# Patient Record
Sex: Male | Born: 1985 | Race: White | Hispanic: No | Marital: Single | Smoking: Current every day smoker
Health system: Southern US, Community
[De-identification: ages and names within clinical notes are randomized; demographics above are authoritative.]

---

## 2004-09-23 ENCOUNTER — Emergency Department: Payer: Self-pay | Admitting: Internal Medicine

## 2008-02-18 ENCOUNTER — Emergency Department: Payer: Self-pay | Admitting: Internal Medicine

## 2008-09-22 ENCOUNTER — Emergency Department: Payer: Self-pay

## 2009-01-29 ENCOUNTER — Emergency Department: Payer: Self-pay | Admitting: Emergency Medicine

## 2009-02-01 ENCOUNTER — Emergency Department: Payer: Self-pay | Admitting: Emergency Medicine

## 2011-02-14 ENCOUNTER — Emergency Department: Payer: Self-pay | Admitting: *Deleted

## 2011-03-02 IMAGING — CR DG HAND COMPLETE 3+V*L*
1 series · 3 of 3 positions shown · non-contrast
Comparison: none

REASON FOR EXAM: left hand pain s/p cat bite
COMMENTS:   LMP: (Male)

PROCEDURE:     DXR - DXR HAND LT COMPLETE  W/OBLIQUES  - February 02, 2009  [DATE]
RESULT:     Images of the left hand demonstrate no fracture, dislocation or
radiopaque foreign body. There is no evidence of bony destruction.

[Series 1: view not recorded · 0.17mm/px · 3 of 3 slices shown]
[im 1/3]
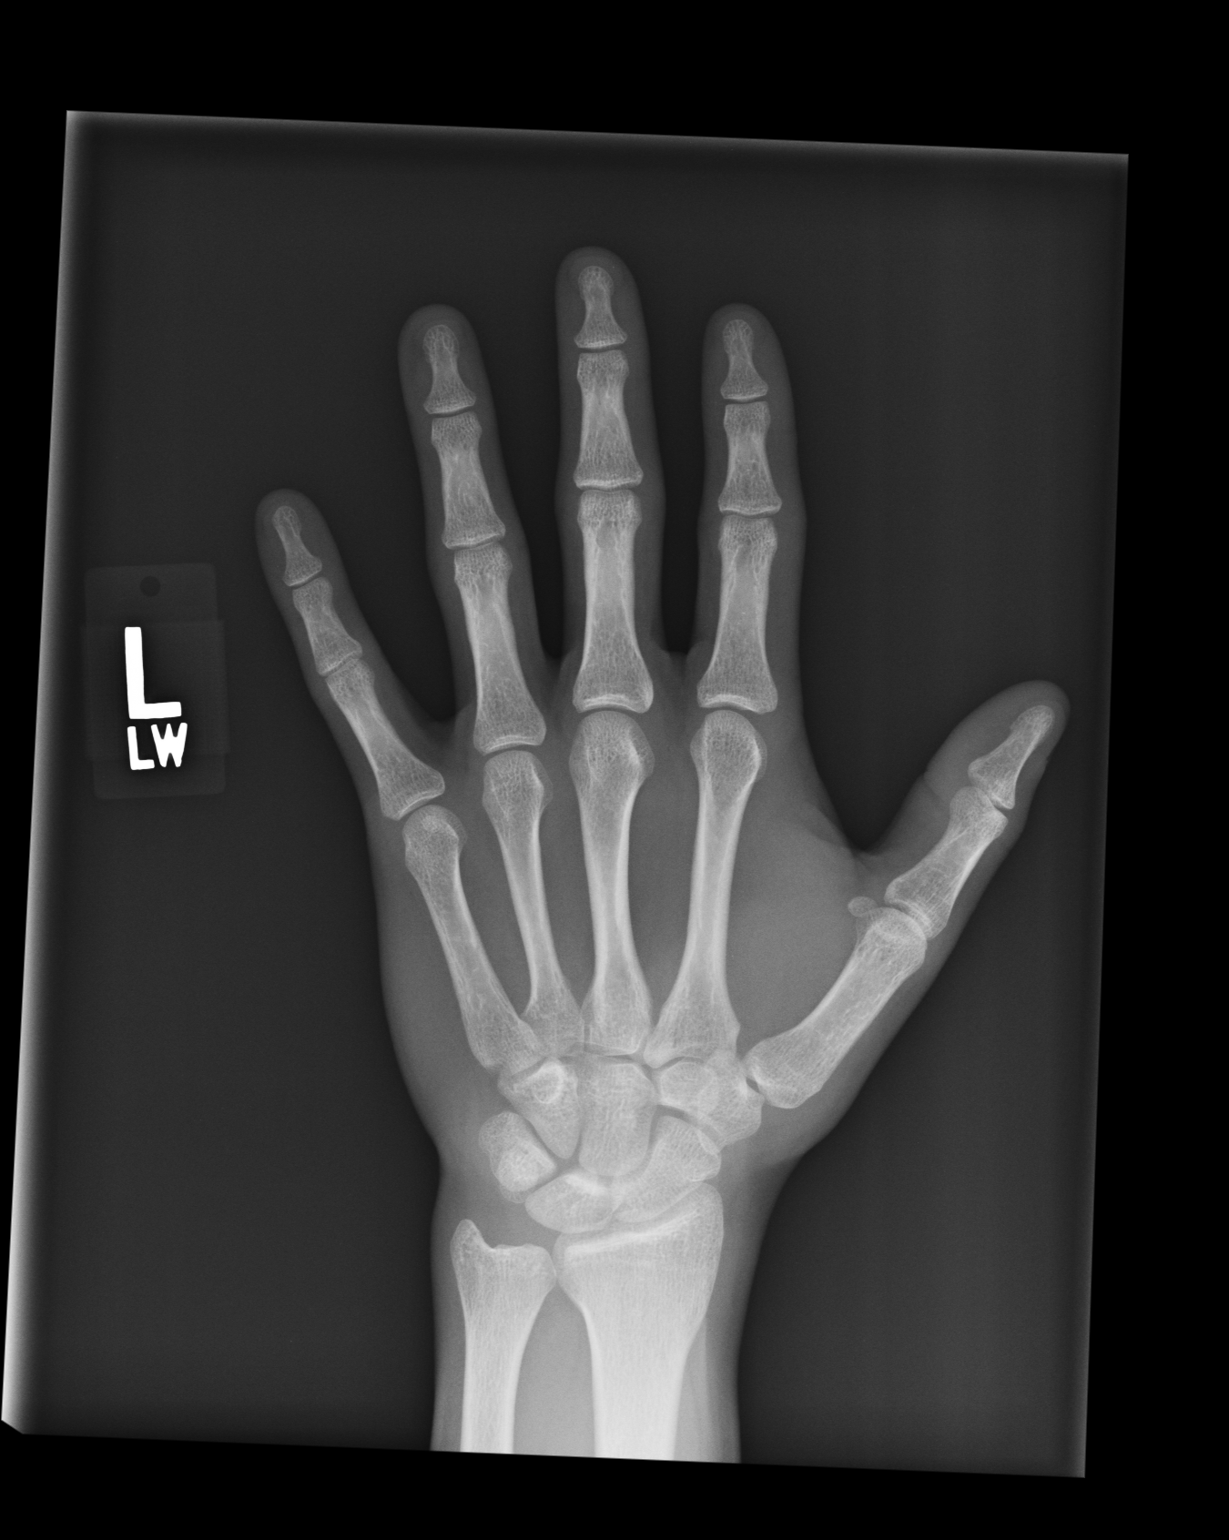
[im 2/3]
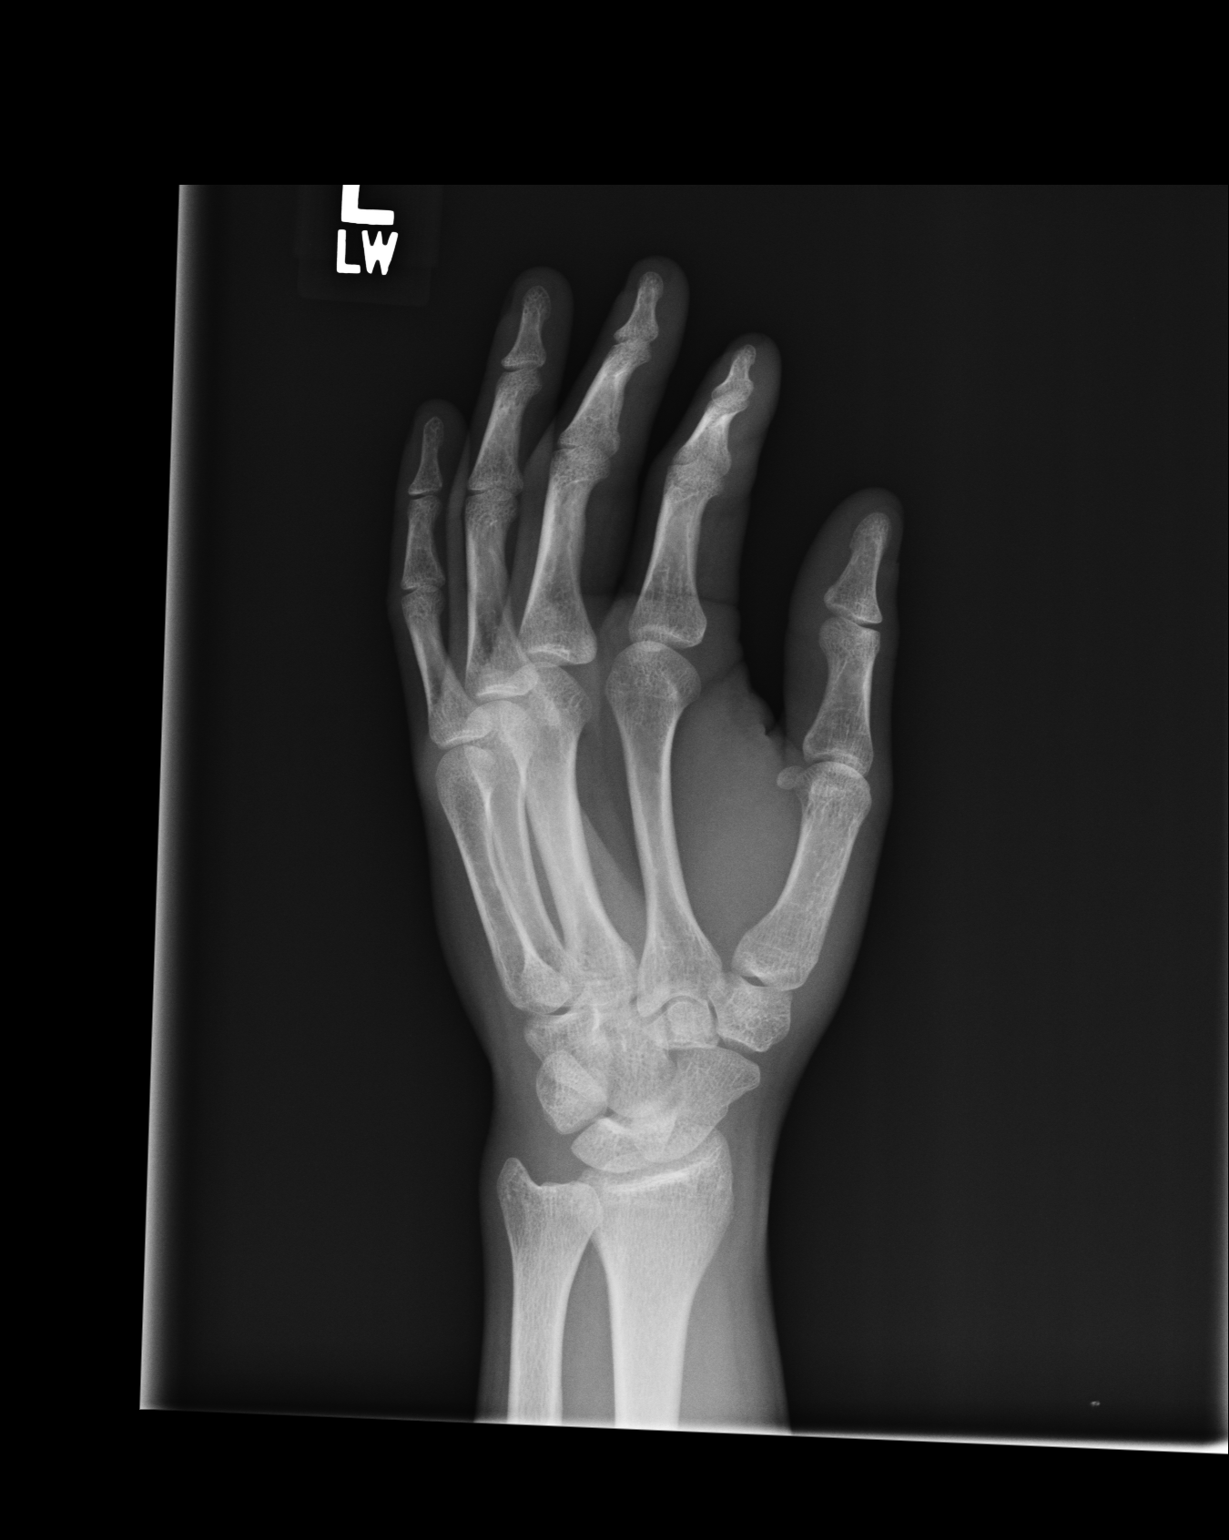
[im 3/3]
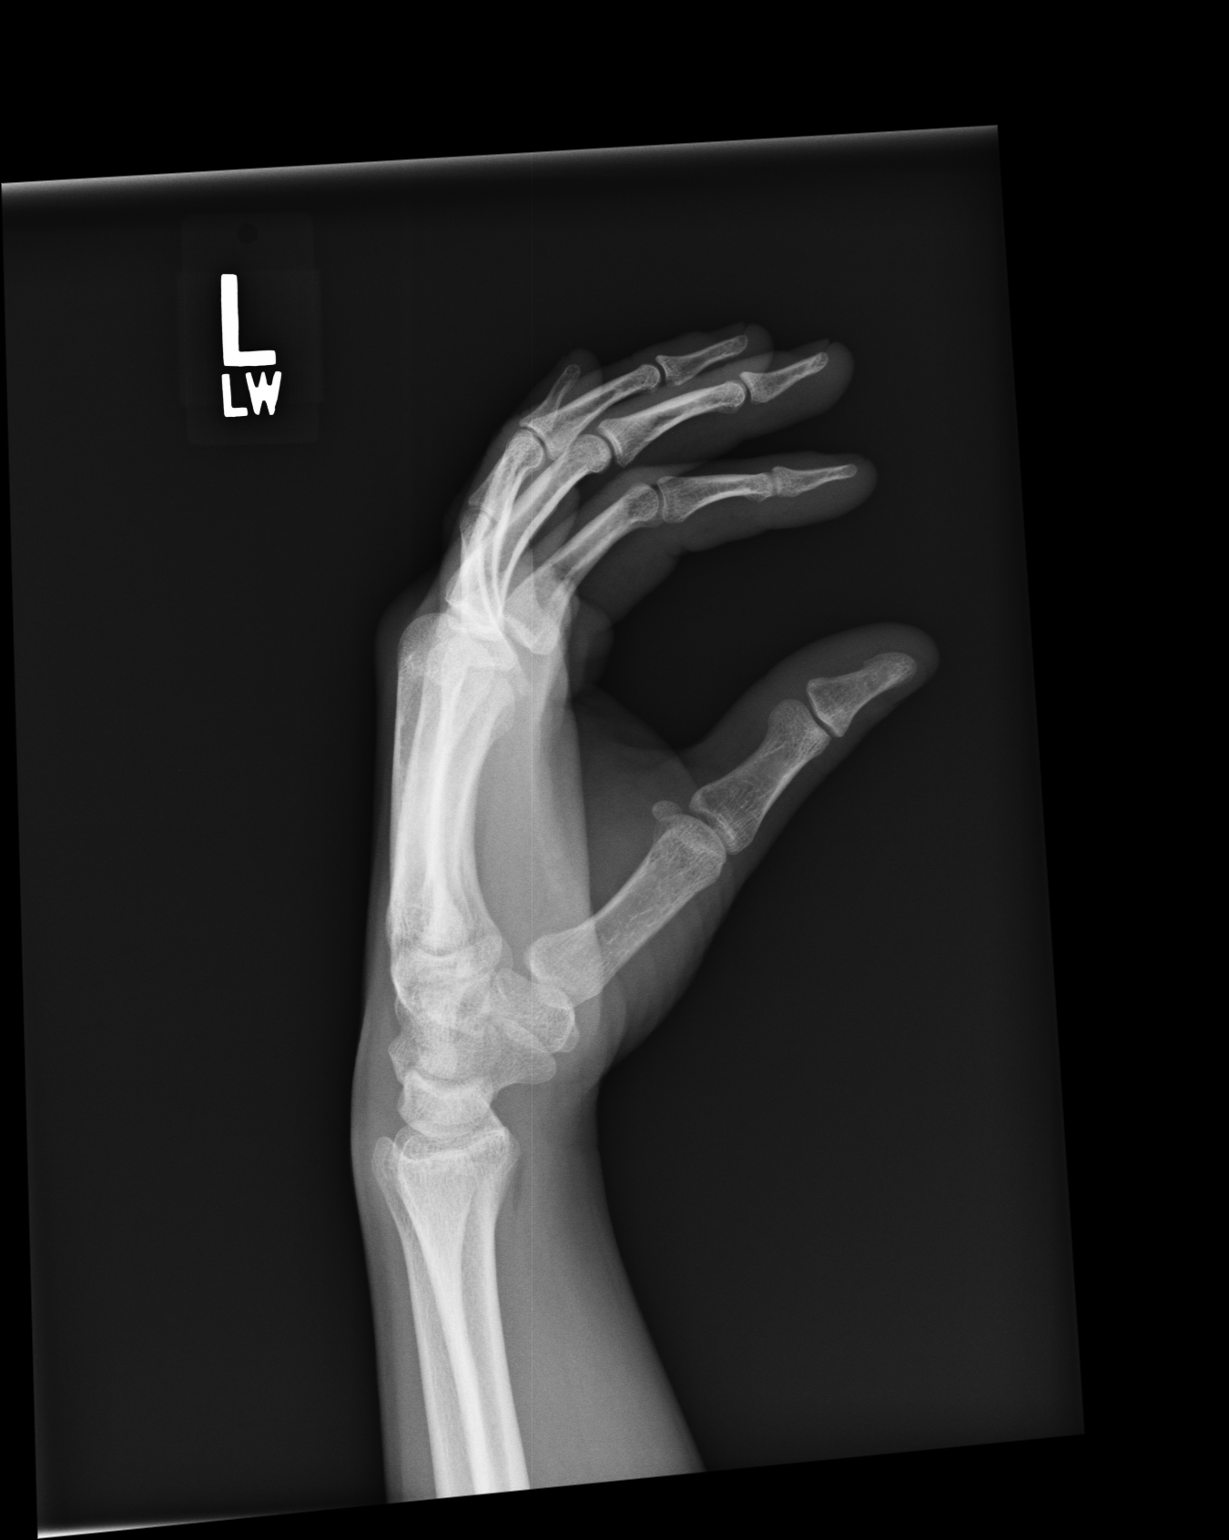

[3 of 3 positions shown; findings below may reference images not displayed]

IMPRESSION: Please see above.

## 2013-11-22 ENCOUNTER — Emergency Department: Payer: Self-pay | Admitting: Emergency Medicine

## 2013-11-22 LAB — COMPREHENSIVE METABOLIC PANEL
ALBUMIN: 4.2 g/dL (ref 3.4–5.0)
ALK PHOS: 69 U/L
ALT: 19 U/L (ref 12–78)
Anion Gap: 4 — ABNORMAL LOW (ref 7–16)
BILIRUBIN TOTAL: 0.3 mg/dL (ref 0.2–1.0)
BUN: 7 mg/dL (ref 7–18)
CALCIUM: 9.4 mg/dL (ref 8.5–10.1)
CREATININE: 0.83 mg/dL (ref 0.60–1.30)
Chloride: 107 mmol/L (ref 98–107)
Co2: 28 mmol/L (ref 21–32)
EGFR (African American): >60
Glucose: 92 mg/dL (ref 65–99)
Osmolality: 275 (ref 275–301)
Potassium: 4.1 mmol/L (ref 3.5–5.1)
SGOT(AST): 27 U/L (ref 15–37)
Sodium: 139 mmol/L (ref 136–145)
Total Protein: 8.2 g/dL (ref 6.4–8.2)

## 2013-11-22 LAB — CBC WITH DIFFERENTIAL/PLATELET
Basophil #: 0.1 10*3/uL (ref 0.0–0.1)
Basophil %: 0.9 %
Eosinophil #: 0.3 10*3/uL (ref 0.0–0.7)
Eosinophil %: 3.8 %
HCT: 44.2 % (ref 40.0–52.0)
HGB: 15 g/dL (ref 13.0–18.0)
LYMPHS PCT: 31.3 %
Lymphocyte #: 2.1 10*3/uL (ref 1.0–3.6)
MCH: 33.1 pg (ref 26.0–34.0)
MCHC: 33.9 g/dL (ref 32.0–36.0)
MCV: 98 fL (ref 80–100)
MONO ABS: 0.6 x10 3/mm (ref 0.2–1.0)
Monocyte %: 8.7 %
NEUTROS PCT: 55.3 %
Neutrophil #: 3.7 10*3/uL (ref 1.4–6.5)
Platelet: 227 10*3/uL (ref 150–440)
RBC: 4.53 10*6/uL (ref 4.40–5.90)
RDW: 13.2 % (ref 11.5–14.5)
WBC: 6.8 10*3/uL (ref 3.8–10.6)

## 2013-11-22 LAB — URINALYSIS, COMPLETE
Bacteria: NONE SEEN
Bilirubin,UR: NEGATIVE
Blood: NEGATIVE
Glucose,UR: NEGATIVE mg/dL (ref 0–75)
Ketone: NEGATIVE
Leukocyte Esterase: NEGATIVE
NITRITE: NEGATIVE
Ph: 7 (ref 4.5–8.0)
Protein: NEGATIVE
RBC,UR: 1 /HPF (ref 0–5)
SQUAMOUS EPITHELIAL: NONE SEEN
Specific Gravity: 1.015 (ref 1.003–1.030)
WBC UR: 1 /HPF (ref 0–5)

## 2014-01-17 ENCOUNTER — Emergency Department: Payer: Self-pay | Admitting: Internal Medicine

## 2014-06-15 ENCOUNTER — Emergency Department: Payer: Self-pay | Admitting: Emergency Medicine

## 2014-06-15 LAB — CBC
HCT: 47.3 % (ref 40.0–52.0)
HGB: 16 g/dL (ref 13.0–18.0)
MCH: 32.4 pg (ref 26.0–34.0)
MCHC: 33.7 g/dL (ref 32.0–36.0)
MCV: 96 fL (ref 80–100)
PLATELETS: 246 10*3/uL (ref 150–440)
RBC: 4.92 10*6/uL (ref 4.40–5.90)
RDW: 13 % (ref 11.5–14.5)
WBC: 9.3 10*3/uL (ref 3.8–10.6)

## 2014-06-15 LAB — URINALYSIS, COMPLETE
BACTERIA: NONE SEEN
BILIRUBIN, UR: NEGATIVE
BLOOD: NEGATIVE
Glucose,UR: >=500 mg/dL (ref 0–75)
Ketone: NEGATIVE
Leukocyte Esterase: NEGATIVE
Nitrite: NEGATIVE
Ph: 5 (ref 4.5–8.0)
Protein: NEGATIVE
RBC, UR: NONE SEEN /HPF (ref 0–5)
SQUAMOUS EPITHELIAL: NONE SEEN
Specific Gravity: 1.017 (ref 1.003–1.030)
WBC UR: <1 /HPF (ref 0–5)

## 2014-06-15 LAB — SALICYLATE LEVEL: Salicylates, Serum: 3.3 mg/dL — ABNORMAL HIGH

## 2014-06-15 LAB — COMPREHENSIVE METABOLIC PANEL
ALBUMIN: 4.2 g/dL (ref 3.4–5.0)
ANION GAP: 6 — AB (ref 7–16)
Alkaline Phosphatase: 76 U/L
BUN: 11 mg/dL (ref 7–18)
Bilirubin,Total: 0.2 mg/dL (ref 0.2–1.0)
Calcium, Total: 9.1 mg/dL (ref 8.5–10.1)
Chloride: 102 mmol/L (ref 98–107)
Co2: 29 mmol/L (ref 21–32)
Creatinine: 1.26 mg/dL (ref 0.60–1.30)
EGFR (African American): >60
EGFR (Non-African Amer.): >60
GLUCOSE: 97 mg/dL (ref 65–99)
OSMOLALITY: 273 (ref 275–301)
POTASSIUM: 3.8 mmol/L (ref 3.5–5.1)
SGOT(AST): 15 U/L (ref 15–37)
SGPT (ALT): 19 U/L
SODIUM: 137 mmol/L (ref 136–145)
TOTAL PROTEIN: 8.1 g/dL (ref 6.4–8.2)

## 2014-06-15 LAB — DRUG SCREEN, URINE
Amphetamines, Ur Screen: NEGATIVE (ref ?–1000)
BARBITURATES, UR SCREEN: NEGATIVE (ref ?–200)
BENZODIAZEPINE, UR SCRN: NEGATIVE (ref ?–200)
Cannabinoid 50 Ng, Ur ~~LOC~~: NEGATIVE (ref ?–50)
Cocaine Metabolite,Ur ~~LOC~~: NEGATIVE (ref ?–300)
MDMA (Ecstasy)Ur Screen: NEGATIVE (ref ?–500)
METHADONE, UR SCREEN: NEGATIVE (ref ?–300)
Opiate, Ur Screen: POSITIVE (ref ?–300)
Phencyclidine (PCP) Ur S: NEGATIVE (ref ?–25)
Tricyclic, Ur Screen: NEGATIVE (ref ?–1000)

## 2014-06-15 LAB — ACETAMINOPHEN LEVEL

## 2014-06-15 LAB — ETHANOL: Ethanol: <3 mg/dL

## 2016-11-27 ENCOUNTER — Emergency Department
Admission: EM | Admit: 2016-11-27 | Discharge: 2016-11-27 | Disposition: A | Discharge status: Home / Self Care | Payer: Self-pay | Attending: Student in an Organized Health Care Education/Training Program | Admitting: Student in an Organized Health Care Education/Training Program

## 2016-11-27 ENCOUNTER — Encounter: Payer: Self-pay | Admitting: Emergency Medicine

## 2016-11-27 DIAGNOSIS — F17200 Nicotine dependence, unspecified, uncomplicated: Secondary | ICD-10-CM | POA: Insufficient documentation

## 2016-11-27 DIAGNOSIS — F1110 Opioid abuse, uncomplicated: Secondary | ICD-10-CM | POA: Insufficient documentation

## 2016-11-27 LAB — CBC
HEMATOCRIT: 42.2 % (ref 40.0–52.0)
Hemoglobin: 14.8 g/dL (ref 13.0–18.0)
MCH: 33.7 pg (ref 26.0–34.0)
MCHC: 35.1 g/dL (ref 32.0–36.0)
MCV: 96 fL (ref 80.0–100.0)
Platelets: 252 10*3/uL (ref 150–440)
RBC: 4.39 MIL/uL — ABNORMAL LOW (ref 4.40–5.90)
RDW: 12.8 % (ref 11.5–14.5)
WBC: 7.4 10*3/uL (ref 3.8–10.6)

## 2016-11-27 LAB — URINE DRUG SCREEN, QUALITATIVE (ARMC ONLY)
AMPHETAMINES, UR SCREEN: NOT DETECTED
BENZODIAZEPINE, UR SCRN: NOT DETECTED
Barbiturates, Ur Screen: NOT DETECTED
Cannabinoid 50 Ng, Ur ~~LOC~~: NOT DETECTED
Cocaine Metabolite,Ur ~~LOC~~: POSITIVE — AB
MDMA (ECSTASY) UR SCREEN: NOT DETECTED
Methadone Scn, Ur: NOT DETECTED
OPIATE, UR SCREEN: NOT DETECTED
PHENCYCLIDINE (PCP) UR S: NOT DETECTED
Tricyclic, Ur Screen: NOT DETECTED

## 2016-11-27 LAB — COMPREHENSIVE METABOLIC PANEL
ALT: 14 U/L — AB (ref 17–63)
AST: 19 U/L (ref 15–41)
Albumin: 3.7 g/dL (ref 3.5–5.0)
Alkaline Phosphatase: 63 U/L (ref 38–126)
Anion gap: 5 (ref 5–15)
BUN: 10 mg/dL (ref 6–20)
CHLORIDE: 105 mmol/L (ref 101–111)
CO2: 27 mmol/L (ref 22–32)
CREATININE: 0.87 mg/dL (ref 0.61–1.24)
Calcium: 9.2 mg/dL (ref 8.9–10.3)
GFR calc Af Amer: >60 mL/min (ref >60)
GFR calc non Af Amer: >60 mL/min (ref >60)
Glucose, Bld: 89 mg/dL (ref 65–99)
Potassium: 3.5 mmol/L (ref 3.5–5.1)
SODIUM: 137 mmol/L (ref 135–145)
Total Bilirubin: 0.3 mg/dL (ref 0.3–1.2)
Total Protein: 7.4 g/dL (ref 6.5–8.1)

## 2016-11-27 LAB — ETHANOL: Alcohol, Ethyl (B): <5 mg/dL (ref <5)

## 2016-11-27 MED ORDER — PROMETHAZINE HCL 12.5 MG PO TABS: 12.5000 mg | ORAL_TABLET | Freq: Four times a day (QID) | ORAL | 0 refills | Status: DC | PRN

## 2016-11-27 MED ORDER — CLONIDINE HCL 0.1 MG PO TABS
0.2000 mg | ORAL_TABLET | Freq: Once | ORAL | Status: AC
  Administered 2016-11-27: 0.2 mg via ORAL
  Filled 2016-11-27: qty 2

## 2016-11-27 MED ORDER — CLONIDINE HCL 0.1 MG PO TABS: 0.1000 mg | ORAL_TABLET | Freq: Two times a day (BID) | ORAL | 0 refills | Status: DC

## 2016-11-27 NOTE — ED Notes (Signed)

## 2016-11-27 NOTE — ED Provider Notes (Signed)
[facility] Emergency Department Provider Note    First MD Initiated Contact with Patient 11/27/16 2057     (approximate)  I have reviewed the triage vital signs and the nursing notes.   HISTORY  Chief Complaint Drug Problem    HPI Randell PatientWilliam F Abila Jr. is a 31 y.o. male who presents with desire to seek rehabilitation and detox from narcotics. Patient had an IVC paperwork taken out by his girlfriend . He said that he was suicidal is very frustrated trying to get into detox. Patient denies any suicidal ideation at this time. Is eager to get help with his narcotic addiction. Does admit to using cocaine several days ago.   History reviewed. No pertinent past medical history. History reviewed. No pertinent family history. History reviewed. No pertinent surgical history. There are no active problems to display for this patient.     Prior to Admission medications   Not on File    Allergies Patient has no known allergies.    Social History Social History  Substance Use Topics  . Smoking status: Current Every Day Smoker  . Smokeless tobacco: Never Used  . Alcohol use No    Review of Systems Patient denies headaches, rhinorrhea, blurry vision, numbness, shortness of breath, chest pain, edema, cough, abdominal pain, nausea, vomiting, diarrhea, dysuria, fevers, rashes or hallucinations unless otherwise stated above in HPI. ____________________________________________   PHYSICAL EXAM:  VITAL SIGNS: Vitals:   11/27/16 2044  BP: (!) 146/97  Pulse: 89  Resp: 18  Temp: 98.7 F (37.1 C)    Constitutional: Alert and oriented. Well appearing and in no acute distress. Eyes: Conjunctivae are normal.  Head: Atraumatic. Nose: No congestion/rhinnorhea. Mouth/Throat: Mucous membranes are moist.   Neck: No stridor. Painless ROM.  Cardiovascular: Normal rate, regular rhythm. Grossly normal heart sounds.  Good peripheral circulation. Respiratory:  Normal respiratory effort.  No retractions. Lungs CTAB. Gastrointestinal: Soft and nontender. No distention. No abdominal bruits. No CVA tenderness. Genitourinary:  Musculoskeletal: No lower extremity tenderness nor edema.  No joint effusions. Neurologic:  Normal speech and language. No gross focal neurologic deficits are appreciated. No facial droop Skin:  Skin is warm, dry and intact. No rash noted. Psychiatric: Mood and affect are normal. Speech and behavior are normal.  ____________________________________________   LABS (all labs ordered are listed, but only abnormal results are displayed)  Results for orders placed or performed during the hospital encounter of 11/27/16 (from the past 24 hour(s))  cbc     Status: Abnormal   Collection Time: 11/27/16  8:44 PM  Result Value Ref Range   WBC 7.4 3.8 - 10.6 K/uL   RBC 4.39 (L) 4.40 - 5.90 MIL/uL   Hemoglobin 14.8 13.0 - 18.0 g/dL   HCT 16.142.2 09.640.0 - 04.552.0 %   MCV 96.0 80.0 - 100.0 fL   MCH 33.7 26.0 - 34.0 pg   MCHC 35.1 32.0 - 36.0 g/dL   RDW 40.912.8 81.111.5 - 91.414.5 %   Platelets 252 150 - 440 K/uL   ____________________________________________ ____________________________________________  RADIOLOGY   ____________________________________________   PROCEDURES  Procedure(s) performed:  Procedures    Critical Care performed: no ____________________________________________   INITIAL IMPRESSION / ASSESSMENT AND PLAN / ED COURSE  Pertinent labs & imaging results that were available during my care of the patient were reviewed by me and considered in my medical decision making (see chart for details).  DDX: Psychosis, delirium, medication effect, noncompliance, polysubstance abuse, Si, Hi, depression   Titus DubinWilliam F  Abelino Tippin. is a 31 y.o. who presents to the ED with complaint and IVC taken out by girlfriend as above. Patient without any SI. He is hemodynamically stable. Patient is a diabetic psychiatry and felt not to require  admission to hospital and did not meet any IVC criteria. Patient given a symptomatically management for narcotic withdrawal and referral to outpatient rehabilitation.      ____________________________________________   FINAL CLINICAL IMPRESSION(S) / ED DIAGNOSES  Final diagnoses:  Narcotic abuse, episodic      NEW MEDICATIONS STARTED DURING THIS VISIT:  New Prescriptions   No medications on file     Note:  This document was prepared using Dragon voice recognition software and may include unintentional dictation errors.    Willy Eddy, MD 11/28/16 [phone]

## 2016-11-27 NOTE — ED Notes (Signed)
Pt d/c to lobby, walked out with this RN with steady gait. Using phone at [employer] desk to contact ride.pt calm and cooperative at d/c.

## 2016-11-27 NOTE — ED Triage Notes (Signed)
Pt ambulatory to triage in NAD, report wants help with opiate abuse, reports last use was Tuesday, reports some anxiety but denies n/v and pain.  Pt reports normally takes pills, oxycodone specifically.

## 2019-07-11 ENCOUNTER — Ambulatory Visit: Payer: HRSA Program | Attending: Internal Medicine

## 2019-07-11 DIAGNOSIS — Z20822 Contact with and (suspected) exposure to covid-19: Secondary | ICD-10-CM | POA: Diagnosis not present

## 2019-07-12 LAB — NOVEL CORONAVIRUS, NAA: SARS-CoV-2, NAA: NOT DETECTED

## 2019-10-02 ENCOUNTER — Emergency Department
Admission: EM | Admit: 2019-10-02 | Discharge: 2019-10-02 | Disposition: A | Discharge status: Home / Self Care | Payer: Self-pay | Attending: Emergency Medicine | Admitting: Emergency Medicine

## 2019-10-02 ENCOUNTER — Other Ambulatory Visit: Payer: Self-pay

## 2019-10-02 DIAGNOSIS — T507X1A Poisoning by analeptics and opioid receptor antagonists, accidental (unintentional), initial encounter: Secondary | ICD-10-CM | POA: Insufficient documentation

## 2019-10-02 DIAGNOSIS — R110 Nausea: Secondary | ICD-10-CM | POA: Insufficient documentation

## 2019-10-02 DIAGNOSIS — T40601A Poisoning by unspecified narcotics, accidental (unintentional), initial encounter: Secondary | ICD-10-CM

## 2019-10-02 DIAGNOSIS — F17200 Nicotine dependence, unspecified, uncomplicated: Secondary | ICD-10-CM | POA: Insufficient documentation

## 2019-10-02 DIAGNOSIS — R5381 Other malaise: Secondary | ICD-10-CM | POA: Insufficient documentation

## 2019-10-02 DIAGNOSIS — T401X1A Poisoning by heroin, accidental (unintentional), initial encounter: Secondary | ICD-10-CM | POA: Insufficient documentation

## 2019-10-02 LAB — ACETAMINOPHEN LEVEL: Acetaminophen (Tylenol), Serum: <10 ug/mL — ABNORMAL LOW (ref 10–30)

## 2019-10-02 LAB — COMPREHENSIVE METABOLIC PANEL
ALT: 11 U/L (ref 0–44)
AST: 16 U/L (ref 15–41)
Albumin: 3.3 g/dL — ABNORMAL LOW (ref 3.5–5.0)
Alkaline Phosphatase: 61 U/L (ref 38–126)
Anion gap: 5 (ref 5–15)
BUN: 8 mg/dL (ref 6–20)
CO2: 27 mmol/L (ref 22–32)
Calcium: 9.1 mg/dL (ref 8.9–10.3)
Chloride: 105 mmol/L (ref 98–111)
Creatinine, Ser: 0.72 mg/dL (ref 0.61–1.24)
GFR calc Af Amer: >60 mL/min (ref >60)
GFR calc non Af Amer: >60 mL/min (ref >60)
Glucose, Bld: 88 mg/dL (ref 70–99)
Potassium: 3.6 mmol/L (ref 3.5–5.1)
Sodium: 137 mmol/L (ref 135–145)
Total Bilirubin: 0.3 mg/dL (ref 0.3–1.2)
Total Protein: 7.8 g/dL (ref 6.5–8.1)

## 2019-10-02 LAB — CBC WITH DIFFERENTIAL/PLATELET
Abs Immature Granulocytes: 0.03 10*3/uL (ref 0.00–0.07)
Basophils Absolute: 0.1 10*3/uL (ref 0.0–0.1)
Basophils Relative: 1 %
Eosinophils Absolute: 0.1 10*3/uL (ref 0.0–0.5)
Eosinophils Relative: 1 %
HCT: 36.8 % — ABNORMAL LOW (ref 39.0–52.0)
Hemoglobin: 12.9 g/dL — ABNORMAL LOW (ref 13.0–17.0)
Immature Granulocytes: 0 %
Lymphocytes Relative: 20 %
Lymphs Abs: 1.9 10*3/uL (ref 0.7–4.0)
MCH: 30.5 pg (ref 26.0–34.0)
MCHC: 35.1 g/dL (ref 30.0–36.0)
MCV: 87 fL (ref 80.0–100.0)
Monocytes Absolute: 0.9 10*3/uL (ref 0.1–1.0)
Monocytes Relative: 10 %
Neutro Abs: 6.7 10*3/uL (ref 1.7–7.7)
Neutrophils Relative %: 68 %
Platelets: 275 10*3/uL (ref 150–400)
RBC: 4.23 MIL/uL (ref 4.22–5.81)
RDW: 12.3 % (ref 11.5–15.5)
WBC: 9.8 10*3/uL (ref 4.0–10.5)
nRBC: 0 % (ref 0.0–0.2)

## 2019-10-02 LAB — SALICYLATE LEVEL: Salicylate Lvl: <7 mg/dL — ABNORMAL LOW (ref 7.0–30.0)

## 2019-10-02 LAB — ETHANOL: Alcohol, Ethyl (B): <10 mg/dL (ref <10)

## 2019-10-02 MED ORDER — ONDANSETRON HCL 4 MG/2ML IJ SOLN
INTRAMUSCULAR | Status: AC
  Filled 2019-10-02: qty 2

## 2019-10-02 MED ORDER — ONDANSETRON HCL 4 MG/2ML IJ SOLN
4.0000 mg | Freq: Once | INTRAMUSCULAR | Status: AC
  Administered 2019-10-02: 20:00:00 4 mg via INTRAVENOUS

## 2019-10-02 MED ORDER — LACTATED RINGERS IV BOLUS
1000.0000 mL | Freq: Once | INTRAVENOUS | Status: AC
  Administered 2019-10-02: 20:00:00 1000 mL via INTRAVENOUS

## 2019-10-02 MED ORDER — ONDANSETRON 4 MG PO TBDP: 4.0000 mg | ORAL_TABLET | Freq: Three times a day (TID) | ORAL | 0 refills | Status: DC | PRN

## 2019-10-02 NOTE — ED Notes (Signed)
Pt resting comfortably at this time. Stretcher locked in lowest position, call bell within reach. Denies needs at this time

## 2019-10-02 NOTE — ED Notes (Signed)
Girlfriend Lelon Mast contacted to pickup for discharge

## 2019-10-02 NOTE — ED Notes (Signed)
Pt denies being able to provide urine sample at this time

## 2019-10-02 NOTE — ED Triage Notes (Addendum)
Pt to ED via [facility] from home for drug overdose. Admits to using IV heroin and suboxone. EMS reports no narcan given, pt ambulatory on scene. Pt alert and talkative, 100% on RA  Pt denies SI/HI

## 2019-10-02 NOTE — ED Notes (Signed)
Pt's girlfriend Lelon Mast called to check on pt [phone]. Verbal permission by pt to share information and updates. This RN attempted to call girlfriend back and update, no answer

## 2019-10-02 NOTE — ED Notes (Signed)
Pt actively vomiting, verbal order 4mg  zofran IV Dr 

## 2019-10-02 NOTE — ED Notes (Signed)
Girlfriend Lelon Mast updated, verbal permission from pt

## 2019-10-02 NOTE — ED Notes (Signed)
Pt given pillow and warm blanket as requested

## 2019-10-02 NOTE — ED Provider Notes (Signed)
[facility] Emergency Department Provider Note   ____________________________________________   First MD Initiated Contact with Patient 10/02/19 1926     (approximate)  I have reviewed the triage vital signs and the nursing notes.   HISTORY  Chief Complaint Drug Overdose    HPI Brett Avila. is a 34 y.o. male with possible history of opiate abuse who presents to the ED complaining of overdose.  Patient reports that he injected both heroin and Suboxone earlier today and has been "feeling bad" since then.  He is unable to specify any other symptoms, denies fever, cough, chest pain, shortness of breath, vomiting, diarrhea, or abdominal pain.  He denies any other drug use or alcohol consumption this evening.  He admits to regularly injecting heroin, denies attempting to harm himself this evening.        History reviewed. No pertinent past medical history.  There are no problems to display for this patient.   History reviewed. No pertinent surgical history.  Prior to Admission medications   Medication Sig Start Date End Date Taking? Authorizing Provider  cloNIDine (CATAPRES) 0.1 MG tablet Take 1 tablet (0.1 mg total) by mouth 2 (two) times daily. 11/27/16   Merlyn Lot, MD  ondansetron (ZOFRAN ODT) 4 MG disintegrating tablet Take 1 tablet (4 mg total) by mouth every 8 (eight) hours as needed for nausea or vomiting. 10/02/19   Blake Divine, MD  promethazine (PHENERGAN) 12.5 MG tablet Take 1 tablet (12.5 mg total) by mouth every 6 (six) hours as needed for nausea or vomiting. 11/27/16   Merlyn Lot, MD    Allergies Patient has no known allergies.  No family history on file.  Social History Social History   Tobacco Use  . Smoking status: Current Every Day Smoker  . Smokeless tobacco: Never Used  Substance Use Topics  . Alcohol use: No  . Drug use: Yes    Types: IV    Review of Systems  Constitutional: No fever/chills.   Positive for malaise. Eyes: No visual changes. ENT: No sore throat. Cardiovascular: Denies chest pain. Respiratory: Denies shortness of breath. Gastrointestinal: No abdominal pain.  No nausea, no vomiting.  No diarrhea.  No constipation. Genitourinary: Negative for dysuria. Musculoskeletal: Negative for back pain. Skin: Negative for rash. Neurological: Negative for headaches, focal weakness or numbness.  ____________________________________________   PHYSICAL EXAM:  VITAL SIGNS: ED Triage Vitals  Enc Vitals Group     BP      Pulse      Resp      Temp      Temp src      SpO2      Weight      Height      Head Circumference      Peak Flow      Pain Score      Pain Loc      Pain Edu?      Excl. in [place]?     Constitutional: Alert and oriented. Eyes: Conjunctivae are normal. Head: Atraumatic. Nose: No congestion/rhinnorhea. Mouth/Throat: Mucous membranes are moist. Neck: Normal ROM Cardiovascular: Normal rate, regular rhythm. Grossly normal heart sounds. Respiratory: Normal respiratory effort.  No retractions. Lungs CTAB. Gastrointestinal: Soft and nontender. No distention. Genitourinary: deferred Musculoskeletal: No lower extremity tenderness nor edema. Neurologic:  Normal speech and language. No gross focal neurologic deficits are appreciated. Skin:  Skin is warm, dry and intact. No rash noted.  Track marks to left antecubital fossa with no erythema, tenderness,  or warmth. Psychiatric: Mood and affect are normal. Speech and behavior are normal.  ____________________________________________   LABS (all labs ordered are listed, but only abnormal results are displayed)  Labs Reviewed  CBC WITH DIFFERENTIAL/PLATELET - Abnormal; Notable for the following components:      Result Value   Hemoglobin 12.9 (*)    HCT 36.8 (*)    All other components within normal limits  COMPREHENSIVE METABOLIC PANEL - Abnormal; Notable for the following components:   Albumin 3.3 (*)     All other components within normal limits  SALICYLATE LEVEL - Abnormal; Notable for the following components:   Salicylate Lvl <7.0 (*)    All other components within normal limits  ACETAMINOPHEN LEVEL - Abnormal; Notable for the following components:   Acetaminophen (Tylenol), Serum <10 (*)    All other components within normal limits  ETHANOL  URINE DRUG SCREEN, QUALITATIVE ([facility] ONLY)   ____________________________________________  EKG  ED ECG REPORT I, Chesley Noon, the attending physician, personally viewed and interpreted this ECG.   Date: 10/02/2019  EKG Time: 19:26  Rate: 81  Rhythm: normal sinus rhythm  Axis: Normal  Intervals:none  ST&T Change: None   PROCEDURES  Procedure(s) performed (including Critical Care):  Procedures   ____________________________________________   INITIAL IMPRESSION / ASSESSMENT AND PLAN / ED COURSE       34 year old male with history of opiate abuse presents to the ED complaining of "feeling bad" after injecting both Suboxone and heroin earlier today.  He is somnolent here in the ED but easily arousable, has obvious track marks to left arm but no signs of infection.  He does appear intoxicated and we will further assess with labs, give IV fluid bolus and monitor.  He specifically denies any suicidal ideation today.  Patient complained of some nausea but this improved following dose of Zofran.  He is now much more awake and alert, answering questions appropriately.  He appears clinically sober and is appropriate for discharge home, will provide with resources for substance abuse.  Patient agrees with plan.      ____________________________________________   FINAL CLINICAL IMPRESSION(S) / ED DIAGNOSES  Final diagnoses:  Opiate overdose, accidental or unintentional, initial encounter (HCC)  Nausea     ED Discharge Orders         Ordered    ondansetron (ZOFRAN ODT) 4 MG disintegrating tablet  Every 8 hours PRN     10/02/19  2212           Note:  This document was prepared using Dragon voice recognition software and may include unintentional dictation errors.   Chesley Noon, MD 10/02/19 2214

## 2022-09-08 ENCOUNTER — Ambulatory Visit
Admission: RE | Admit: 2022-09-08 | Discharge: 2022-09-08 | Disposition: A | Discharge status: Home / Self Care | Payer: Self-pay | Source: Ambulatory Visit | Attending: Family Medicine | Admitting: Family Medicine

## 2022-09-08 VITALS — BP 143/97 | HR 96 | Temp 98.3°F | Resp 18

## 2022-09-08 DIAGNOSIS — W57XXXA Bitten or stung by nonvenomous insect and other nonvenomous arthropods, initial encounter: Secondary | ICD-10-CM

## 2022-09-08 DIAGNOSIS — S70362A Insect bite (nonvenomous), left thigh, initial encounter: Secondary | ICD-10-CM

## 2022-09-08 DIAGNOSIS — L03116 Cellulitis of left lower limb: Secondary | ICD-10-CM

## 2022-09-08 MED ORDER — DOXYCYCLINE HYCLATE 100 MG PO CAPS: 100.0000 mg | ORAL_CAPSULE | Freq: Two times a day (BID) | ORAL | 0 refills | Status: AC

## 2022-09-08 NOTE — ED Triage Notes (Signed)
Pt presents with a ?bug bite to his left thigh x 2 days. The area is red and painful.

## 2022-09-08 NOTE — Discharge Instructions (Signed)
Stop by the pharmacy to pick up your prescriptions.  Wear sunscreen as doxycycline may make you sun sensitive.  Follow up with your primary care provider as needed.

## 2022-09-08 NOTE — ED Provider Notes (Signed)
[facility]    CSN: [id number] Arrival date & time: 09/08/22  1434      History   Chief Complaint Chief Complaint  Patient presents with   Insect Bite    HPI Brett Avila. is a 37 y.o. male.   HPI  History from: patient. Brett Avila. is a 37 y.o. male who reports finding a red area on his left anterior thigh.  States he was outside working on his bike and felt an itchy sensation.  He does not recall a bug bite but the area has begun to be more red and spreading up his leg.  Symptoms for started a few days ago.  He is otherwise feeling well. No fever, headaches, nausea, vomiting, visual changes, extremity edema reported. Ambulatory without difficulty. No OTC treatment.    History reviewed. No pertinent past medical history.  There are no problems to display for this patient.   History reviewed. No pertinent surgical history.     Home Medications    Prior to Admission medications   Medication Sig Start Date End Date Taking? Authorizing Provider  doxycycline (VIBRAMYCIN) 100 MG capsule Take 1 capsule (100 mg total) by mouth 2 (two) times daily for 10 days. 09/08/22 09/18/22 Yes Kaleth Koy, DO  cloNIDine (CATAPRES) 0.1 MG tablet Take 1 tablet (0.1 mg total) by mouth 2 (two) times daily. 11/27/16   Merlyn Lot, MD    Family History No family history on file.  Social History Social History   Tobacco Use   Smoking status: Every Day   Smokeless tobacco: Never  Vaping Use   Vaping Use: Never used  Substance Use Topics   Alcohol use: No   Drug use: Not Currently    Types: IV     Allergies   Patient has no known allergies.   Review of Systems Review of Systems : :negative unless otherwise stated in HPI.      Physical Exam Triage Vital Signs ED Triage Vitals  Enc Vitals Group     BP 09/08/22 1502 (!) 143/97     Pulse Rate 09/08/22 1502 96     Resp 09/08/22 1502 18     Temp 09/08/22 1502 98.3 F (36.8 C)     Temp Source  09/08/22 1502 Oral     SpO2 09/08/22 1502 98 %     Weight --      Height --      Head Circumference --      Peak Flow --      Pain Score 09/08/22 1501 3     Pain Loc --      Pain Edu? --      Excl. in [place]? --    No data found.  Updated Vital Signs BP (!) 143/97 (BP Location: Right Arm)   Pulse 96   Temp 98.3 F (36.8 C) (Oral)   Resp 18   SpO2 98%   Visual Acuity Right Eye Distance:   Left Eye Distance:   Bilateral Distance:    Right Eye Near:   Left Eye Near:    Bilateral Near:     Physical Exam  GEN: alert, well appearing male, in no acute distress  EYES: extra occular movements intact, no scleral injection CV: regular rate  RESP: no increased work of breathing, MSK: no extremity edema, no gross deformities NEURO: alert, moves all extremities appropriately SKIN: warm and dry; very slight 2 cm induration of nodule without fluctuance and surrounding erythema with  streaking medially; parts of insect appreciated; excoriations visible   UC Treatments / Results  Labs (all labs ordered are listed, but only abnormal results are displayed) Labs Reviewed - No data to display  EKG   Radiology No results found.  Procedures Procedures (including critical care time)  Medications Ordered in UC Medications - No data to display  Initial Impression / Assessment and Plan / UC Course  I have reviewed the triage vital signs and the nursing notes.  Pertinent labs & imaging results that were available during my care of the patient were reviewed by me and considered in my medical decision making (see chart for details).      Pt is a 37 y.o. male who presents after presumed insect bite.  Overall, Brett Avila is well appearing, well hydrated and in no respiratory distress.  VSS and pt is afebrile.  Exam concerning for cellulitis given the erythema warmth and streaking.  Treat with doxycycline for 10 days.  Return and ED precautions given and he voiced understanding.  Note provided  at patient's request.  Reviewed expectations re: course of current medical issues. Questions answered.  Outlined signs and symptoms indicating need for more acute intervention. Patient verbalized understanding. After Visit Summary given.   Final Clinical Impressions(s) / UC Diagnoses   Final diagnoses:  Insect bite of left thigh, initial encounter  Cellulitis of left lower extremity     Discharge Instructions      Stop by the pharmacy to pick up your prescriptions.  Wear sunscreen as doxycycline may make you sun sensitive.  Follow up with your primary care provider as needed.      ED Prescriptions     Medication Sig Dispense Auth. Provider   doxycycline (VIBRAMYCIN) 100 MG capsule Take 1 capsule (100 mg total) by mouth 2 (two) times daily for 10 days. 20 capsule Lyndee Hensen, DO      PDMP not reviewed this encounter.              Lyndee Hensen, DO 09/08/22 1547

## 2023-07-01 ENCOUNTER — Ambulatory Visit: Payer: Self-pay | Admitting: Physician Assistant

## 2023-07-01 ENCOUNTER — Encounter: Payer: Self-pay | Admitting: Physician Assistant

## 2023-07-01 VITALS — BP 126/94 | HR 94 | Temp 97.9°F | Ht 66.0 in | Wt 169.0 lb

## 2023-07-01 DIAGNOSIS — Z1159 Encounter for screening for other viral diseases: Secondary | ICD-10-CM

## 2023-07-01 DIAGNOSIS — Z0000 Encounter for general adult medical examination without abnormal findings: Secondary | ICD-10-CM | POA: Diagnosis not present

## 2023-07-01 DIAGNOSIS — Z2821 Immunization not carried out because of patient refusal: Secondary | ICD-10-CM | POA: Diagnosis not present

## 2023-07-01 DIAGNOSIS — F17200 Nicotine dependence, unspecified, uncomplicated: Secondary | ICD-10-CM | POA: Insufficient documentation

## 2023-07-01 DIAGNOSIS — F1191 Opioid use, unspecified, in remission: Secondary | ICD-10-CM | POA: Insufficient documentation

## 2023-07-01 DIAGNOSIS — Z114 Encounter for screening for human immunodeficiency virus [HIV]: Secondary | ICD-10-CM

## 2023-07-01 DIAGNOSIS — Z13220 Encounter for screening for lipoid disorders: Secondary | ICD-10-CM

## 2023-07-01 DIAGNOSIS — I10 Essential (primary) hypertension: Secondary | ICD-10-CM | POA: Insufficient documentation

## 2023-07-01 DIAGNOSIS — K219 Gastro-esophageal reflux disease without esophagitis: Secondary | ICD-10-CM | POA: Insufficient documentation

## 2023-07-01 MED ORDER — HYDROCHLOROTHIAZIDE 25 MG PO TABS: 25.0000 mg | ORAL_TABLET | Freq: Every day | ORAL | 1 refills | Status: DC

## 2023-07-01 MED ORDER — OMEPRAZOLE 20 MG PO CPDR: 20.0000 mg | DELAYED_RELEASE_CAPSULE | Freq: Every day | ORAL | 2 refills | Status: DC

## 2023-07-01 NOTE — Progress Notes (Signed)
Date:  07/01/2023   Name:  Brett Avila.   DOB:  03-Mar-1986   MRN:  [id number]   Chief Complaint: Establish Care, Hypertension (Check bp at home 100/100s, no symptoms, went to eye doctor it was elevated ), and Gastroesophageal Reflux (X2-3 years, daily, Gets bad heart burn, takes tums, sometimes vomiting /)  Hypertension Pertinent negatives include no chest pain, headaches, palpitations or shortness of breath.  Gastroesophageal Reflux He reports no abdominal pain, no chest pain, no coughing or no wheezing. This is a recurrent problem. Pertinent negatives include no fatigue. He has tried an antacid for the symptoms. The treatment provided mild relief.   Brett Avila is a very pleasant 38 y.o. male with history of tobacco use disorder (1.5 ppd) and HTN new to the practice to establish care.  He has never had a routine physical as an adult.  Recently got health insurance and is trying to get up-to-date with everything, having recently completed dental and eye exams.  Hx opioid dependence, sober 2 years.   Last Physical: pediatrics Last Dental Exam: 3 months ago, having teeth pulled, planning dentures Last Eye Exam: 2 days ago, normal   He has been told by several providers over the years that he has high blood pressure, most recently at the dentist and eye doctor.  He endorses drinking two Monsters daily, plus a cup of coffee and maybe a soda.   Struggling with GERD recently.  Takes OTC omeprazole most days and Tums every day.  Sometimes wakes up with acute regurgitation leading to spit up/vomit.  Has never seen GI.  Medication list has been reviewed and updated.  Current Meds  Medication Sig   hydrochlorothiazide (HYDRODIURIL) 25 MG tablet Take 1 tablet (25 mg total) by mouth daily.   omeprazole (PRILOSEC) 20 MG capsule Take 1 capsule (20 mg total) by mouth daily.     Review of Systems  Constitutional:  Negative for activity change, appetite change, fatigue and unexpected weight  change.  HENT:  Positive for dental problem. Negative for hearing loss and trouble swallowing.   Eyes:  Negative for visual disturbance.  Respiratory:  Negative for cough, chest tightness, shortness of breath and wheezing.   Cardiovascular:  Negative for chest pain, palpitations and leg swelling.  Gastrointestinal:  Negative for abdominal distention, abdominal pain, blood in stool, constipation and diarrhea.       Reflux  Endocrine: Negative for polydipsia, polyphagia and polyuria.  Genitourinary:  Negative for difficulty urinating, dysuria, enuresis, frequency, hematuria and testicular pain.  Musculoskeletal:  Negative for arthralgias, gait problem and joint swelling.  Skin:  Negative for rash and wound.  Neurological:  Negative for dizziness, syncope, weakness and headaches.  Psychiatric/Behavioral:  Negative for behavioral problems and sleep disturbance.     There are no active problems to display for this patient.   No Known Allergies   There is no immunization history on file for this patient.  History reviewed. No pertinent surgical history.  Social History   Tobacco Use   Smoking status: Every Day    Current packs/day: 1.50    Average packs/day: 1.5 packs/day for 21.1 years (31.6 ttl pk-yrs)    Types: Cigarettes    Start date: 2004   Smokeless tobacco: Never  Vaping Use   Vaping status: Never Used  Substance Use Topics   Alcohol use: No   Drug use: Not Currently    History reviewed. No pertinent family history.      07/01/2023  10:07 AM  GAD 7 : Generalized Anxiety Score  Nervous, Anxious, on Edge 0  Control/stop worrying 0  Worry too much - different things 0  Trouble relaxing 0  Restless 0  Easily annoyed or irritable 0  Afraid - awful might happen 0  Total GAD 7 Score 0  Anxiety Difficulty Not difficult at all       07/01/2023   10:07 AM  Depression screen PHQ 2/9  Decreased Interest 0  Down, Depressed, Hopeless 0  PHQ - 2 Score 0    BP  Readings from Last 3 Encounters:  07/01/23 (!) 126/94  09/08/22 (!) 143/97  10/02/19 136/80    Wt Readings from Last 3 Encounters:  10/02/19 124 lb (56.2 kg)  11/27/16 125 lb (56.7 kg)    BP (!) 126/94 (BP Location: Left Arm, Patient Position: Sitting, Cuff Size: Normal)   Pulse 94   Temp 97.9 F (36.6 C)   Ht 5\' 6"  (1.676 m)   SpO2 98%   BMI 20.01 kg/m   Physical Exam Vitals and nursing note reviewed.  Constitutional:      Appearance: Normal appearance.  HENT:     Ears:     Comments: EAC clear bilaterally with good view of TM which is without effusion or erythema.     Nose: Nose normal.     Mouth/Throat:     Mouth: Mucous membranes are moist. No oral lesions.     Dentition: Abnormal dentition (missing nearly all teeth). No gum lesions.     Tongue: No lesions.     Pharynx: No posterior oropharyngeal erythema.  Eyes:     Extraocular Movements: Extraocular movements intact.     Conjunctiva/sclera: Conjunctivae normal.     Pupils: Pupils are equal, round, and reactive to light.  Neck:     Thyroid: No thyromegaly.  Cardiovascular:     Rate and Rhythm: Normal rate and regular rhythm.     Heart sounds: No murmur heard.    No friction rub. No gallop.     Comments: Pulses 2+ at radial, PT, DP bilaterally. No carotid bruit. No peripheral edema Pulmonary:     Effort: Pulmonary effort is normal.     Breath sounds: Normal breath sounds.  Abdominal:     General: Bowel sounds are normal.     Palpations: Abdomen is soft. There is no mass.     Tenderness: There is no abdominal tenderness.  Genitourinary:    Comments: Genital/rectal exam deferred. Reviewed technique for testicular self-exam. Musculoskeletal:     Comments: Full ROM with strength 5/5 bilateral upper and lower extremities  Lymphadenopathy:     Cervical: No cervical adenopathy.  Skin:    General: Skin is warm.     Capillary Refill: Capillary refill takes less than 2 seconds.     Findings: No lesion or rash.   Neurological:     Mental Status: He is alert and oriented to person, place, and time.     Gait: Gait is intact.  Psychiatric:        Mood and Affect: Mood normal.        Behavior: Behavior normal.     Recent Labs     Component Value Date/Time   NA 137 10/02/2019 1941   NA 137 06/15/2014 1425   K 3.6 10/02/2019 1941   K 3.8 06/15/2014 1425   CL 105 10/02/2019 1941   CL 102 06/15/2014 1425   CO2 27 10/02/2019 1941   CO2 29 06/15/2014 1425  GLUCOSE 88 10/02/2019 1941   GLUCOSE 97 06/15/2014 1425   BUN 8 10/02/2019 1941   BUN 11 06/15/2014 1425   CREATININE 0.72 10/02/2019 1941   CREATININE 1.26 06/15/2014 1425   CALCIUM 9.1 10/02/2019 1941   CALCIUM 9.1 06/15/2014 1425   PROT 7.8 10/02/2019 1941   PROT 8.1 06/15/2014 1425   ALBUMIN 3.3 (L) 10/02/2019 1941   ALBUMIN 4.2 06/15/2014 1425   AST 16 10/02/2019 1941   AST 15 06/15/2014 1425   ALT 11 10/02/2019 1941   ALT 19 06/15/2014 1425   ALKPHOS 61 10/02/2019 1941   ALKPHOS 76 06/15/2014 1425   BILITOT 0.3 10/02/2019 1941   BILITOT 0.2 06/15/2014 1425   GFRNONAA >60 10/02/2019 1941   GFRNONAA >60 06/15/2014 1425   GFRNONAA >60 11/22/2013 1232   GFRAA >60 10/02/2019 1941   GFRAA >60 06/15/2014 1425   GFRAA >60 11/22/2013 1232    Lab Results  Component Value Date   WBC 9.8 10/02/2019   HGB 12.9 (L) 10/02/2019   HCT 36.8 (L) 10/02/2019   MCV 87.0 10/02/2019   PLT 275 10/02/2019   No results found for: "HGBA1C" No results found for: "CHOL", "HDL", "LDLCALC", "LDLDIRECT", "TRIG", "CHOLHDL" No results found for: "TSH"   Assessment and Plan:  1. Annual physical exam (Primary) Seemingly healthy patient with minor abnormalities on exam. Encouraged healthy lifestyle including regular physical activity and nutritive diet. Continue routine dental and eye exams.  Checking baseline labs today  - CBC with Differential/Platelet - Comprehensive metabolic panel - TSH - Lipid panel - Hepatitis C antibody - HIV Antibody  (routine testing w rflx)  2. Immunization declined Vaccinations due today, but declined  3. Screening for hyperlipidemia - Lipid panel  4. Need for hepatitis C screening test - Hepatitis C antibody  5. Screening for HIV (human immunodeficiency virus) - HIV Antibody (routine testing w rflx)  6. Primary hypertension Begin HCTZ as below, continue home BP monitoring with log for my review next time.  Reviewed the "Seven S's" of hypertension including salt, smoking, stimulants (e.g. caffeine), stress, sleep, snoring (OSA), sedentary lifestyle.  Discussed with patient that if able to quit smoking and reduce caffeine consumption, I truly believe he would not need blood pressure medication.  - hydrochlorothiazide (HYDRODIURIL) 25 MG tablet; Take 1 tablet (25 mg total) by mouth daily.  Dispense: 30 tablet; Refill: 1 - CBC with Differential/Platelet - Comprehensive metabolic panel - TSH - Lipid panel  7. Gastroesophageal reflux disease, unspecified whether esophagitis present Refill omeprazole as below.  Dosing reviewed with patient.  Patient education on GERD attached to today's visit.  Discussed trying to identify food/beverage triggers, avoiding supine position for 1 hour after any oral intake, reducing caffeine, reducing cigarette use.  Consider GI referral if not improving with regular use of omeprazole and positive lifestyle changes.   - omeprazole (PRILOSEC) 20 MG capsule; Take 1 capsule (20 mg total) by mouth daily.  Dispense: 30 capsule; Refill: 2  8. Tobacco use disorder Encouraged cessation.  Patient not sure if he is ready.  Given number for tobacco cessation resources (1 800 quit NOW) may be able to help him with free or low-cost nicotine replacement.  9. 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   Return in about 4 weeks (around 07/29/2023).    Alvester Morin, PA-C, DMSc, Nutritionist [facility] and  [facility] [phone]

## 2023-07-01 NOTE — Patient Instructions (Addendum)
-  It was a pleasure to see you today! Please review your visit summary for helpful information -Lab results are usually available within 1-2 days and we will call once reviewed -I would encourage you to follow your care via MyChart where you can access lab results, notes, messages, and more -If you feel that we did a nice job today, please complete your after-visit survey and leave [place] a Google review! Your CMA today was Mariann Barter and your provider was Alvester Morin, PA-C, DMSc -Please return for follow-up in about 1 month  Reviewed the "Seven S's" of hypertension including salt, smoking, stimulants (e.g. caffeine), stress, sleep, snoring (OSA), sedentary lifestyle.

## 2023-07-02 ENCOUNTER — Encounter: Payer: Self-pay | Admitting: Physician Assistant

## 2023-07-02 DIAGNOSIS — E785 Hyperlipidemia, unspecified: Secondary | ICD-10-CM | POA: Insufficient documentation

## 2023-07-02 LAB — COMPREHENSIVE METABOLIC PANEL
ALT: 39 [IU]/L (ref 0–44)
AST: 29 [IU]/L (ref 0–40)
Albumin: 4.9 g/dL (ref 4.1–5.1)
Alkaline Phosphatase: 75 [IU]/L (ref 44–121)
BUN/Creatinine Ratio: 10 (ref 9–20)
BUN: 11 mg/dL (ref 6–20)
Bilirubin Total: 0.3 mg/dL (ref 0.0–1.2)
CO2: 24 mmol/L (ref 20–29)
Calcium: 10.2 mg/dL (ref 8.7–10.2)
Chloride: 98 mmol/L (ref 96–106)
Creatinine, Ser: 1.09 mg/dL (ref 0.76–1.27)
Globulin, Total: 2.9 g/dL (ref 1.5–4.5)
Glucose: 90 mg/dL (ref 70–99)
Potassium: 4.8 mmol/L (ref 3.5–5.2)
Sodium: 139 mmol/L (ref 134–144)
Total Protein: 7.8 g/dL (ref 6.0–8.5)
eGFR: 89 mL/min/{1.73_m2} (ref >59)

## 2023-07-02 LAB — CBC WITH DIFFERENTIAL/PLATELET
Basophils Absolute: 0.1 10*3/uL (ref 0.0–0.2)
Basos: 1 %
EOS (ABSOLUTE): 0.3 10*3/uL (ref 0.0–0.4)
Eos: 4 %
Hematocrit: 45.4 % (ref 37.5–51.0)
Hemoglobin: 16.5 g/dL (ref 13.0–17.7)
Immature Grans (Abs): 0 10*3/uL (ref 0.0–0.1)
Immature Granulocytes: 0 %
Lymphocytes Absolute: 2.1 10*3/uL (ref 0.7–3.1)
Lymphs: 31 %
MCH: 34.7 pg — ABNORMAL HIGH (ref 26.6–33.0)
MCHC: 36.3 g/dL — ABNORMAL HIGH (ref 31.5–35.7)
MCV: 96 fL (ref 79–97)
Monocytes Absolute: 0.7 10*3/uL (ref 0.1–0.9)
Monocytes: 11 %
Neutrophils Absolute: 3.6 10*3/uL (ref 1.4–7.0)
Neutrophils: 53 %
Platelets: 282 10*3/uL (ref 150–450)
RBC: 4.75 x10E6/uL (ref 4.14–5.80)
RDW: 12.3 % (ref 11.6–15.4)
WBC: 6.7 10*3/uL (ref 3.4–10.8)

## 2023-07-02 LAB — HIV ANTIBODY (ROUTINE TESTING W REFLEX): HIV Screen 4th Generation wRfx: NONREACTIVE

## 2023-07-02 LAB — HEPATITIS C ANTIBODY: Hep C Virus Ab: NONREACTIVE

## 2023-07-02 LAB — LIPID PANEL
Chol/HDL Ratio: 5.3 {ratio} — ABNORMAL HIGH (ref 0.0–5.0)
Cholesterol, Total: 224 mg/dL — ABNORMAL HIGH (ref 100–199)
HDL: 42 mg/dL (ref >39)
LDL Chol Calc (NIH): 150 mg/dL — ABNORMAL HIGH (ref 0–99)
Triglycerides: 174 mg/dL — ABNORMAL HIGH (ref 0–149)
VLDL Cholesterol Cal: 32 mg/dL (ref 5–40)

## 2023-07-02 LAB — TSH: TSH: 1.82 u[IU]/mL (ref 0.450–4.500)

## 2023-08-03 ENCOUNTER — Ambulatory Visit: Payer: Managed Care, Other (non HMO) | Admitting: Physician Assistant

## 2023-08-03 ENCOUNTER — Encounter: Payer: Self-pay | Admitting: Physician Assistant

## 2023-08-03 VITALS — BP 130/82 | HR 91 | Temp 98.3°F | Ht 66.0 in | Wt 171.0 lb

## 2023-08-03 DIAGNOSIS — M25531 Pain in right wrist: Secondary | ICD-10-CM | POA: Insufficient documentation

## 2023-08-03 DIAGNOSIS — K219 Gastro-esophageal reflux disease without esophagitis: Secondary | ICD-10-CM

## 2023-08-03 DIAGNOSIS — I10 Essential (primary) hypertension: Secondary | ICD-10-CM

## 2023-08-03 MED ORDER — HYDROCHLOROTHIAZIDE 25 MG PO TABS: 25.0000 mg | ORAL_TABLET | Freq: Every day | ORAL | 1 refills | Status: DC

## 2023-08-03 NOTE — Progress Notes (Signed)
 Date:  08/03/2023   Name:  Brett Avila.   DOB:  1985-10-21   MRN:  [id number]   Chief Complaint: Medical Management of Chronic Issues  HPI Brett Avila returns for 1 month follow-up on chronic conditions including HTN, GERD, and tobacco use following his annual physical 07/01/2023.  At that visit we prescribed omeprazole 20 mg daily and started HCTZ 25 mg daily.  We also discussed his excessive daily caffeine consumption, as he reported drinking 2 Monsters, 1 cup of coffee, and sometimes a soda. Labs normal last visit aside from moderate HLD  GERD symptoms have completely resolved with daily use of omeprazole.  Patient reports good compliance, tolerance, and efficacy with HCTZ, monitoring blood pressure at home with most readings 120s/80s, though he has not kept a log.   Mentions right-sided wrist pain, especially later in the day.   Medication list has been reviewed and updated.  Current Meds  Medication Sig   omeprazole (PRILOSEC) 20 MG capsule Take 1 capsule (20 mg total) by mouth daily.   [DISCONTINUED] hydrochlorothiazide (HYDRODIURIL) 25 MG tablet Take 1 tablet (25 mg total) by mouth daily.     Review of Systems  Patient Active Problem List   Diagnosis Date Noted   Right wrist pain 08/03/2023   Mild hyperlipidemia 07/02/2023   Primary hypertension 07/01/2023   Gastroesophageal reflux disease 07/01/2023   Tobacco use disorder 07/01/2023   Opioid use disorder in remission 07/01/2023    No Known Allergies   There is no immunization history on file for this patient.  History reviewed. No pertinent surgical history.  Social History   Tobacco Use   Smoking status: Every Day    Current packs/day: 1.50    Average packs/day: 1.5 packs/day for 21.1 years (31.7 ttl pk-yrs)    Types: Cigarettes    Start date: 2004   Smokeless tobacco: Never  Vaping Use   Vaping status: Never Used  Substance Use Topics   Alcohol use: No   Drug use: Not Currently    History  reviewed. No pertinent family history.      08/03/2023    9:02 AM 07/01/2023   10:07 AM  GAD 7 : Generalized Anxiety Score  Nervous, Anxious, on Edge 0 0  Control/stop worrying 0 0  Worry too much - different things 0 0  Trouble relaxing 0 0  Restless 0 0  Easily annoyed or irritable 0 0  Afraid - awful might happen 0 0  Total GAD 7 Score 0 0  Anxiety Difficulty Not difficult at all Not difficult at all       08/03/2023    9:02 AM 07/01/2023   10:07 AM  Depression screen PHQ 2/9  Decreased Interest 0 0  Down, Depressed, Hopeless 0 0  PHQ - 2 Score 0 0    BP Readings from Last 3 Encounters:  08/03/23 130/82  07/01/23 (!) 126/94  09/08/22 (!) 143/97    Wt Readings from Last 3 Encounters:  08/03/23 171 lb (77.6 kg)  07/01/23 169 lb (76.7 kg)  10/02/19 124 lb (56.2 kg)    BP 130/82 (BP Location: Left Arm, Patient Position: Sitting, Cuff Size: Normal)   Pulse 91   Temp 98.3 F (36.8 C)   Ht 5\' 6"  (1.676 m)   Wt 171 lb (77.6 kg)   SpO2 95%   BMI 27.60 kg/m   Physical Exam Vitals and nursing note reviewed.  Constitutional:      Appearance: Normal appearance.  Cardiovascular:  Rate and Rhythm: Normal rate.     Heart sounds: Normal heart sounds.  Pulmonary:     Effort: Pulmonary effort is normal.  Abdominal:     General: There is no distension.  Musculoskeletal:        General: Normal range of motion.     Comments: Right wrist with negative Tinel's, negative Phalen's.  The wrist is without erythema, edema, or tenderness and retains full AROM.  Skin:    General: Skin is warm and dry.  Neurological:     Mental Status: He is alert and oriented to person, place, and time.     Gait: Gait is intact.  Psychiatric:        Mood and Affect: Mood and affect normal.     Recent Labs     Component Value Date/Time   NA 139 07/01/2023 1050   NA 137 06/15/2014 1425   K 4.8 07/01/2023 1050   K 3.8 06/15/2014 1425   CL 98 07/01/2023 1050   CL 102 06/15/2014 1425    CO2 24 07/01/2023 1050   CO2 29 06/15/2014 1425   GLUCOSE 90 07/01/2023 1050   GLUCOSE 88 10/02/2019 1941   GLUCOSE 97 06/15/2014 1425   BUN 11 07/01/2023 1050   BUN 11 06/15/2014 1425   CREATININE 1.09 07/01/2023 1050   CREATININE 1.26 06/15/2014 1425   CALCIUM 10.2 07/01/2023 1050   CALCIUM 9.1 06/15/2014 1425   PROT 7.8 07/01/2023 1050   PROT 8.1 06/15/2014 1425   ALBUMIN 4.9 07/01/2023 1050   ALBUMIN 4.2 06/15/2014 1425   AST 29 07/01/2023 1050   AST 15 06/15/2014 1425   ALT 39 07/01/2023 1050   ALT 19 06/15/2014 1425   ALKPHOS 75 07/01/2023 1050   ALKPHOS 76 06/15/2014 1425   BILITOT 0.3 07/01/2023 1050   BILITOT 0.2 06/15/2014 1425   GFRNONAA >60 10/02/2019 1941   GFRNONAA >60 06/15/2014 1425   GFRNONAA >60 11/22/2013 1232   GFRAA >60 10/02/2019 1941   GFRAA >60 06/15/2014 1425   GFRAA >60 11/22/2013 1232    Lab Results  Component Value Date   WBC 6.7 07/01/2023   HGB 16.5 07/01/2023   HCT 45.4 07/01/2023   MCV 96 07/01/2023   PLT 282 07/01/2023   No results found for: "HGBA1C" Lab Results  Component Value Date   CHOL 224 (H) 07/01/2023   HDL 42 07/01/2023   LDLCALC 150 (H) 07/01/2023   TRIG 174 (H) 07/01/2023   CHOLHDL 5.3 (H) 07/01/2023   Lab Results  Component Value Date   TSH 1.820 07/01/2023     Assessment and Plan:  Primary hypertension Assessment & Plan: Improved.  Not quite at goal.  Reviewed nonpharmacologic means of lowering blood pressure including smoking cessation/reduction and decreasing caffeine intake.  For now, continue HCTZ 25 mg daily, refilling today for 90 tablet dispense.  Orders: -     hydroCHLOROthiazide; Take 1 tablet (25 mg total) by mouth daily.  Dispense: 90 tablet; Refill: 1  Gastroesophageal reflux disease, unspecified whether esophagitis present Assessment & Plan: Much improved.  Continue omeprazole 20 mg daily for the next 4 weeks or so followed by attempted taper and use PRN.  Patient verbalizes  understanding.   Right wrist pain Assessment & Plan: Low suspicion for carpal tunnel at this time. Likely mild tendonitis/overuse. Advised trial of OTC topical diclofenac QID PRN +/- nocturnal wrist splint/compression sleeve.       Return in about 3 months (around 10/31/2023) for OV f/u chronic conditions.  Alvester Morin, PA-C, DMSc, Nutritionist [facility] Primary [facility] and Sports Medicine [facility] [phone]

## 2023-08-03 NOTE — Assessment & Plan Note (Signed)
 Much improved.  Continue omeprazole 20 mg daily for the next 4 weeks or so followed by attempted taper and use PRN.  Patient verbalizes understanding.

## 2023-08-03 NOTE — Assessment & Plan Note (Signed)
 Low suspicion for carpal tunnel at this time. Likely mild tendonitis/overuse. Advised trial of OTC topical diclofenac QID PRN +/- nocturnal wrist splint/compression sleeve.

## 2023-08-03 NOTE — Assessment & Plan Note (Signed)
 Improved.  Not quite at goal.  Reviewed nonpharmacologic means of lowering blood pressure including smoking cessation/reduction and decreasing caffeine intake.  For now, continue HCTZ 25 mg daily, refilling today for 90 tablet dispense.

## 2023-08-26 ENCOUNTER — Other Ambulatory Visit: Payer: Self-pay | Admitting: Physician Assistant

## 2023-08-26 DIAGNOSIS — I10 Essential (primary) hypertension: Secondary | ICD-10-CM

## 2023-11-04 ENCOUNTER — Ambulatory Visit: Payer: Managed Care, Other (non HMO) | Admitting: Physician Assistant

## 2023-12-07 ENCOUNTER — Ambulatory Visit: Admitting: Physician Assistant

## 2023-12-08 ENCOUNTER — Encounter: Payer: Self-pay | Admitting: Physician Assistant

## 2023-12-08 ENCOUNTER — Ambulatory Visit: Admitting: Physician Assistant

## 2023-12-08 VITALS — BP 112/78 | HR 82 | Temp 98.1°F | Ht 66.0 in | Wt 163.0 lb

## 2023-12-08 DIAGNOSIS — F17200 Nicotine dependence, unspecified, uncomplicated: Secondary | ICD-10-CM

## 2023-12-08 DIAGNOSIS — K219 Gastro-esophageal reflux disease without esophagitis: Secondary | ICD-10-CM

## 2023-12-08 DIAGNOSIS — I10 Essential (primary) hypertension: Secondary | ICD-10-CM

## 2023-12-08 MED ORDER — HYDROCHLOROTHIAZIDE 25 MG PO TABS: 25.0000 mg | ORAL_TABLET | Freq: Every day | ORAL | 1 refills | Status: DC

## 2023-12-08 MED ORDER — OMEPRAZOLE 20 MG PO CPDR: 20.0000 mg | DELAYED_RELEASE_CAPSULE | Freq: Every day | ORAL | 2 refills | Status: AC | PRN

## 2023-12-08 NOTE — Assessment & Plan Note (Signed)
 Congratulated on cigarette reduction so far, but patient would like referral to smoking cessation program, interested in quitting.

## 2023-12-08 NOTE — Progress Notes (Signed)
 Date:  12/08/2023   Name:  Brett Avila.   DOB:  10/29/1985   MRN:  [id number]   Chief Complaint: Medical Management of Chronic Issues  HPI Brett Avila presents today for routine 62-month follow-up, mainly for reevaluation of HTN.  He reports taking HCTZ 25 mg with good compliance, typically takes it at night, no complaints of nocturia or any other side effects.  He only occasionally checks blood pressure at home, says it has been normal.  Last visit we discussed taper off of PPI and the potential issues with long-term use of this medication.  He was able to reduce his use of omeprazole , presently reports taking it about 3 times per week.  Still smoking cigarettes daily, about 1 pack/day.  Would be interested in cessation program.   Medication list has been reviewed and updated.  Current Meds  Medication Sig   [DISCONTINUED] hydrochlorothiazide  (HYDRODIURIL ) 25 MG tablet Take 1 tablet (25 mg total) by mouth daily.   [DISCONTINUED] omeprazole  (PRILOSEC) 20 MG capsule Take 1 capsule (20 mg total) by mouth daily.     Review of Systems  Patient Active Problem List   Diagnosis Date Noted   Right wrist pain 08/03/2023   Mild hyperlipidemia 07/02/2023   Primary hypertension 07/01/2023   Gastroesophageal reflux disease 07/01/2023   Tobacco use disorder 07/01/2023   Opioid use disorder in remission 07/01/2023    No Known Allergies   There is no immunization history on file for this patient.  History reviewed. No pertinent surgical history.  Social History   Tobacco Use   Smoking status: Every Day    Current packs/day: 1.00    Average packs/day: 1.5 packs/day for 21.5 years (32.1 ttl pk-yrs)    Types: Cigarettes    Start date: 2004   Smokeless tobacco: Never  Vaping Use   Vaping status: Never Used  Substance Use Topics   Alcohol use: No   Drug use: Not Currently    History reviewed. No pertinent family history.      12/08/2023    3:30 PM 08/03/2023    9:02 AM  07/01/2023   10:07 AM  GAD 7 : Generalized Anxiety Score  Nervous, Anxious, on Edge 0 0 0  Control/stop worrying 0 0 0  Worry too much - different things 0 0 0  Trouble relaxing 0 0 0  Restless 0 0 0  Easily annoyed or irritable 0 0 0  Afraid - awful might happen 0 0 0  Total GAD 7 Score 0 0 0  Anxiety Difficulty Not difficult at all Not difficult at all Not difficult at all       12/08/2023    3:30 PM 08/03/2023    9:02 AM 07/01/2023   10:07 AM  Depression screen PHQ 2/9  Decreased Interest 0 0 0  Down, Depressed, Hopeless 0 0 0  PHQ - 2 Score 0 0 0    BP Readings from Last 3 Encounters:  12/08/23 112/78  08/03/23 130/82  07/01/23 (!) 126/94    Wt Readings from Last 3 Encounters:  12/08/23 163 lb (73.9 kg)  08/03/23 171 lb (77.6 kg)  07/01/23 169 lb (76.7 kg)    BP 112/78 (Cuff Size: Normal)   Pulse 82   Temp 98.1 F (36.7 C)   Ht 5' 6 (1.676 m)   Wt 163 lb (73.9 kg)   SpO2 99%   BMI 26.31 kg/m   Physical Exam Vitals and nursing note reviewed.  Constitutional:  Appearance: Normal appearance.   Cardiovascular:     Rate and Rhythm: Normal rate and regular rhythm.     Heart sounds: No murmur heard.    No friction rub. No gallop.  Pulmonary:     Effort: Pulmonary effort is normal.     Breath sounds: Normal breath sounds. No wheezing, rhonchi or rales.  Abdominal:     General: There is no distension.   Musculoskeletal:        General: Normal range of motion.   Skin:    General: Skin is warm and dry.   Neurological:     Mental Status: He is alert and oriented to person, place, and time.     Gait: Gait is intact.   Psychiatric:        Mood and Affect: Mood and affect normal.     Recent Labs     Component Value Date/Time   NA 139 07/01/2023 1050   NA 137 06/15/2014 1425   K 4.8 07/01/2023 1050   K 3.8 06/15/2014 1425   CL 98 07/01/2023 1050   CL 102 06/15/2014 1425   CO2 24 07/01/2023 1050   CO2 29 06/15/2014 1425   GLUCOSE 90  07/01/2023 1050   GLUCOSE 88 10/02/2019 1941   GLUCOSE 97 06/15/2014 1425   BUN 11 07/01/2023 1050   BUN 11 06/15/2014 1425   CREATININE 1.09 07/01/2023 1050   CREATININE 1.26 06/15/2014 1425   CALCIUM 10.2 07/01/2023 1050   CALCIUM 9.1 06/15/2014 1425   PROT 7.8 07/01/2023 1050   PROT 8.1 06/15/2014 1425   ALBUMIN 4.9 07/01/2023 1050   ALBUMIN 4.2 06/15/2014 1425   AST 29 07/01/2023 1050   AST 15 06/15/2014 1425   ALT 39 07/01/2023 1050   ALT 19 06/15/2014 1425   ALKPHOS 75 07/01/2023 1050   ALKPHOS 76 06/15/2014 1425   BILITOT 0.3 07/01/2023 1050   BILITOT 0.2 06/15/2014 1425   GFRNONAA >60 10/02/2019 1941   GFRNONAA >60 06/15/2014 1425   GFRNONAA >60 11/22/2013 1232   GFRAA >60 10/02/2019 1941   GFRAA >60 06/15/2014 1425   GFRAA >60 11/22/2013 1232    Lab Results  Component Value Date   WBC 6.7 07/01/2023   HGB 16.5 07/01/2023   HCT 45.4 07/01/2023   MCV 96 07/01/2023   PLT 282 07/01/2023   No results found for: HGBA1C Lab Results  Component Value Date   CHOL 224 (H) 07/01/2023   HDL 42 07/01/2023   LDLCALC 150 (H) 07/01/2023   TRIG 174 (H) 07/01/2023   CHOLHDL 5.3 (H) 07/01/2023   Lab Results  Component Value Date   TSH 1.820 07/01/2023     Assessment and Plan:  Primary hypertension Assessment & Plan: Refill HCTZ as below and check BMP today  Orders: -     hydroCHLOROthiazide ; Take 1 tablet (25 mg total) by mouth daily.  Dispense: 90 tablet; Refill: 1 -     Basic metabolic panel with GFR  Gastroesophageal reflux disease, unspecified whether esophagitis present Assessment & Plan: Patient may continue with as needed use of omeprazole   Orders: -     Omeprazole ; Take 1 capsule (20 mg total) by mouth daily as needed.  Dispense: 30 capsule; Refill: 2  Tobacco use disorder Assessment & Plan: Congratulated on cigarette reduction so far, but patient would like referral to smoking cessation program, interested in quitting.  Orders: -     Ambulatory  referral to Virtual Care Smoking Cessation     Return in  about 7 months (around 07/01/2024) for fasting CPE.    Rolan Hoyle, PA-C, DMSc, Nutritionist [facility] and Sports Medicine [facility] [phone]

## 2023-12-08 NOTE — Assessment & Plan Note (Signed)
 Patient may continue with as needed use of omeprazole 

## 2023-12-08 NOTE — Assessment & Plan Note (Signed)
 Refill HCTZ as below and check BMP today

## 2023-12-09 ENCOUNTER — Ambulatory Visit: Payer: Self-pay | Admitting: Physician Assistant

## 2023-12-09 LAB — BASIC METABOLIC PANEL WITH GFR
BUN/Creatinine Ratio: 12 (ref 9–20)
BUN: 12 mg/dL (ref 6–20)
CO2: 25 mmol/L (ref 20–29)
Calcium: 10.2 mg/dL (ref 8.7–10.2)
Chloride: 95 mmol/L — ABNORMAL LOW (ref 96–106)
Creatinine, Ser: 1.01 mg/dL (ref 0.76–1.27)
Glucose: 75 mg/dL (ref 70–99)
Potassium: 3.4 mmol/L — ABNORMAL LOW (ref 3.5–5.2)
Sodium: 137 mmol/L (ref 134–144)
eGFR: 97 mL/min/{1.73_m2} (ref >59)

## 2024-01-20 ENCOUNTER — Telehealth: Admitting: Physician Assistant

## 2024-01-20 ENCOUNTER — Ambulatory Visit

## 2024-01-20 DIAGNOSIS — Z716 Tobacco abuse counseling: Secondary | ICD-10-CM | POA: Diagnosis not present

## 2024-01-20 DIAGNOSIS — F17200 Nicotine dependence, unspecified, uncomplicated: Secondary | ICD-10-CM

## 2024-01-20 MED ORDER — BUPROPION HCL ER (SR) 150 MG PO TB12: 150.0000 mg | ORAL_TABLET | Freq: Two times a day (BID) | ORAL | 0 refills | Status: DC

## 2024-01-20 MED ORDER — NICOTINE 21 MG/24HR TD PT24: 21.0000 mg | MEDICATED_PATCH | Freq: Every day | TRANSDERMAL | 1 refills | Status: DC

## 2024-01-20 NOTE — Patient Instructions (Signed)
 Brett Avila., thank you for joining Delon CHRISTELLA Dickinson, PA-C for today's virtual visit.  While this provider is not your primary care provider (PCP), if your PCP is located in our provider database this encounter information will be shared with them immediately following your visit.  Consent: (Patient) Brett Avila. provided verbal consent for this virtual visit at the beginning of the encounter.  Current Medications:  Current Outpatient Medications:    buPROPion  (WELLBUTRIN  SR) 150 MG 12 hr tablet, Take 1 tablet (150 mg total) by mouth 2 (two) times daily., Disp: 60 tablet, Rfl: 0   nicotine  (NICODERM CQ  - DOSED IN MG/24 HOURS) 21 mg/24hr patch, Place 1 patch (21 mg total) onto the skin daily., Disp: 28 patch, Rfl: 1   hydrochlorothiazide  (HYDRODIURIL ) 25 MG tablet, Take 1 tablet (25 mg total) by mouth daily., Disp: 90 tablet, Rfl: 1   omeprazole  (PRILOSEC) 20 MG capsule, Take 1 capsule (20 mg total) by mouth daily as needed., Disp: 30 capsule, Rfl: 2   When you are getting low on your smoking cessation medications, schedule your next video appointment as previously discussed. This way we can follow-up with you, make any necessary adjustments/changes, and get next fill of medication sent in to your pharmacy.   Follow-Up: Call back or seek an in-person evaluation if the symptoms worsen or if the condition fails to improve as anticipated.  Other Instructions  Congratulations for your interest in quitting smoking!  Quitting smoking is one of the most important things you can do to protect your health.  We are here to help you! Did you know that if you quit smoking 1 pack per day you could save up to $2550.00 per year?  Medications are not appropriate for everyone depending upon your situation and any health issues you may have. If we do prescribe medication, it will be for 1 month at time and you will be required to have a follow up Video visit at 1 month to assess how you are  doing and if there are any side effects.  This could be for up to a total of 3 months.    Support from your friends, family and work colleagues is very important. Please let them know that you are trying to stop smoking so that they understand your need for support in this goal!  - Remove tobacco products from your environment - Set a quit date ideally within 2 weeks. - You should totally abstain from smoking after your quit date. A single puff could hurt your progress or cause you to relapse - If there are others in your household that smoke, ask them to try to quit or abstain from smoking in your presence  You may notice nicotine  withdrawal symptoms such as increased appetite and weight gain, changes in mood, insomnia, irritability and/or anxiety. These symptoms peak in the first three days after smoking cessation and subside over the next 3-4 weeks.   I have prescribed Wellbutrin  (Bupropion  SR) 150 mg tablets. Start these one week (02/01/24) before your selected quit date (02/08/24!!!). This can be taken with or without food. To start, take 1 tablet by mouth daily for 3 days. Then increase to 1 tablet twice daily. We will plan to continue this for a total of 12 weeks after reassessing how you are doing at follow-up. and I have prescribed Nicotine  patches for you. Apply the patch to a non-hairy skin site and rotate the site daily. The starting dose depends on how  many cigarettes per day you were currently at before starting treatment. Because you were smoking > 10 cigarettes per day, I have prescribed a 21 mg patch for you to apply daily for 6 weeks. Then we will plan to decrease to a 14 mg patch daily for 2 weeks, followed by a 7 mg patch daily for 2 more weeks.  We will discuss again in detail at your follow-up visit!  A combination of behavioral and medication can improve the success of you quitting smoking. You may want to use the 1-800-QUIT-NOW free support line.  Also, the Department of Health  and Human Services provides Smoke free apps for smartphones: SharedCustomer.fi  If you happen to break your plan and have a cigarette, keep taking your medications and continue to try to abstain.  Do not give up!  MAKE SURE YOU  Take any prescribed medications only as instructed.  If you miss a dose of medication, take the next dose when it is due and get back on schedule. DO NOT double up on medications.  Mark your calendar to do your Follow Up Smoking Cessation Visit in one month   If you have been instructed to have an in-person evaluation today at a local Urgent Care facility, please use the link below. It will take you to a list of all of our available [facility], including address, phone number and hours of operation. Please do not delay care.  [facility]  If you or a family member do not have a primary care provider, use the link below to schedule a visit and establish care. When you choose a [facility] primary care physician or advanced practice provider, you gain a long-term partner in health. Find a Primary Care Provider  Learn more about [facility]'s in-office and virtual care options: [facility] - Get Care Now

## 2024-01-20 NOTE — Progress Notes (Signed)
 Virtual Visit Consent   Brett Niznik., you are scheduled for a virtual visit with a [facility] provider today. Just as with appointments in the office, your consent must be obtained to participate. Your consent will be active for this visit and any virtual visit you may have with one of our providers in the next 365 days. If you have a MyChart account, a copy of this consent can be sent to you electronically.  As this is a virtual visit, video technology does not allow for your provider to perform a traditional examination. This may limit your provider's ability to fully assess your condition. If your provider identifies any concerns that need to be evaluated in person or the need to arrange testing (such as labs, EKG, etc.), we will make arrangements to do so. Although advances in technology are sophisticated, we cannot ensure that it will always work on either your end or our end. If the connection with a video visit is poor, the visit may have to be switched to a telephone visit. With either a video or telephone visit, we are not always able to ensure that we have a secure connection.  By engaging in this virtual visit, you consent to the provision of healthcare and authorize for your insurance to be billed (if applicable) for the services provided during this visit. Depending on your insurance coverage, you may receive a charge related to this service.  I need to obtain your verbal consent now. Are you willing to proceed with your visit today? Brett JULIANNA Emilio Mickey. has provided verbal consent on 01/20/2024 for a virtual visit (video or telephone). Brett CHRISTELLA Dickinson, PA-C  Date: 01/20/2024 6:37 PM  Virtual Visit via Video Note   I, Brett Avila, connected with  Brett Avila.  ([id number], [birth year]) on 01/20/24 at  6:00 PM EDT by a video-enabled telemedicine application and verified that I am speaking with the correct person using two identifiers.  Location: Patient: Virtual  Visit Location Patient: Home Provider: Virtual Visit Location Provider: Home Office   I discussed the limitations of evaluation and management by telemedicine and the availability of in person appointments. The patient expressed understanding and agreed to proceed.    History of Present Illness: Brett Avila is wanting to discuss their tobacco use and is seeking assistance with cessation. Patient has been smoking/using around 25-30 cigarettes per day for the past 20 years. There has been prior attempts to quit without success. Nicotine patches and gum, cold [place].   Triggers: The following trigger(s) for use has/have been identified: psychological: after meals, social, work, car  Withdrawal Symptoms: Identified withdrawal symptoms: irritable mood.  State of Readiness: Patient feels overall Ready to quit. Concerns/Barriers to Cessation - Withdrawal symptoms  Observations/Objective: Patient is well-developed, well-nourished in no acute distress.  Resting comfortably at home.  Head is normocephalic, atraumatic.  No labored breathing.  Speech is clear and coherent with logical content.  Patient is alert and oriented at baseline.    Assessment and Plan: There are no diagnoses linked to this encounter.  - Patient is currently at the following State of Change: Preparation -- actively planning an attempt to quit - Comorbid conditions identified: HTN, HLD, GERD - Reviewed impacts of smoking on patient's health. - Quit date set for 02/08/24. - I have prescribed Wellbutrin (Bupropion SR) 150 mg tablets. The patient is to start these one week before their selected quit date. This can be taken with or without food. To start, will  have patient take 1 tablet by mouth daily for 3 days. Then increase to 1 tablet twice daily. We will plan to continue this for a total of 12 weeks after reassessing how the patient is doing at follow-up. and I have prescribed Nicotine  patches.  The patient is to apply the  patch to a non-hairy skin site and rotate the site daily. Will begin with starting dose of Because you were smoking > 10 cigarettes per day, I have prescribed a 21 mg patch for you to apply daily for 6 weeks. Then we will plan to decrease to a 14 mg patch daily for 2 weeks, followed by a 7 mg patch daily for 2 more weeks.  We will discuss again in detail at your follow-up visit! - Other resources including the [employer] and Department of Health and [employer] -- have been included in the patient's written instructions and sent to their MyChart. - Plan for follow-up in 6 weeks  Follow Up Instructions: I discussed the assessment and treatment plan with the patient. The patient was provided an opportunity to ask questions and all were answered. The patient agreed with the plan and demonstrated an understanding of the instructions.  A copy of instructions were sent to the patient via MyChart unless otherwise noted below.    Time:  I have spent 35 minutes with the patient via telehealth technology in tobacco cessation counseling.    Brett CHRISTELLA Dickinson, PA-C

## 2024-01-28 ENCOUNTER — Encounter: Payer: Self-pay | Admitting: Physician Assistant

## 2024-01-28 DIAGNOSIS — Z716 Tobacco abuse counseling: Secondary | ICD-10-CM

## 2024-01-28 MED ORDER — NICOTINE 21 MG/24HR TD PT24: 21.0000 mg | MEDICATED_PATCH | Freq: Every day | TRANSDERMAL | 1 refills | Status: DC

## 2024-02-23 ENCOUNTER — Telehealth: Admitting: Physician Assistant

## 2024-02-23 ENCOUNTER — Encounter: Payer: Self-pay | Admitting: Physician Assistant

## 2024-02-23 DIAGNOSIS — F17210 Nicotine dependence, cigarettes, uncomplicated: Secondary | ICD-10-CM

## 2024-02-23 MED ORDER — VARENICLINE TARTRATE 0.5 MG PO TABS: ORAL_TABLET | ORAL | 0 refills | Status: AC

## 2024-02-23 NOTE — Patient Instructions (Signed)
 Brett Avila., thank you for joining Brett Velma Lunger, PA-C for today's virtual visit.  While this provider is not your primary care provider (PCP), if your PCP is located in our provider database this encounter information will be shared with them immediately following your visit.  Consent: (Patient) Brett Avila. provided verbal consent for this virtual visit at the beginning of the encounter.  Current Medications:  Current Outpatient Medications:  .  varenicline  (CHANTIX ) 0.5 MG tablet, Take 1 tablet PO QD x 3 days. Increase to 1 tablet BID days 4-8. Then increase to 2 tablets BID. TAKE WITH FULL GLASS OF WATER, Disp: 100 tablet, Rfl: 0 .  hydrochlorothiazide  (HYDRODIURIL ) 25 MG tablet, Take 1 tablet (25 mg total) by mouth daily., Disp: 90 tablet, Rfl: 1 .  omeprazole  (PRILOSEC) 20 MG capsule, Take 1 capsule (20 mg total) by mouth daily as needed., Disp: 30 capsule, Rfl: 2   When you are getting low on your smoking cessation medications, schedule your next video appointment as previously discussed. This way we can follow-up with you, make any necessary adjustments/changes, and get next fill of medication sent in to your pharmacy.   Follow-Up: Call back or seek an in-person evaluation if the symptoms worsen or if the condition fails to improve as anticipated.  Other Instructions  Congratulations for your interest in quitting smoking!  Quitting smoking is one of the most important things you can do to protect your health.  We are here to help you! Did you know that if you quit smoking 1 pack per day you could save up to $2550.00 per year?  Medications are not appropriate for everyone depending upon your situation and any health issues you may have. If we do prescribe medication, it will be for 1 month at time and you will be required to have a follow up Video visit at 1 month to assess how you are doing and if there are any side effects.  This could be for up to a total of 3  months.    Support from your friends, family and work colleagues is very important. Please let them know that you are trying to stop smoking so that they understand your need for support in this goal!  - Remove tobacco products from your environment - Set a quit date ideally within 2 weeks. - You should totally abstain from smoking after your quit date. A single puff could hurt your progress or cause you to relapse - If there are others in your household that smoke, ask them to try to quit or abstain from smoking in your presence  You may notice nicotine  withdrawal symptoms such as increased appetite and weight gain, changes in mood, insomnia, irritability and/or anxiety. These symptoms peak in the first three days after smoking cessation and subside over the next 3-4 weeks.   I have prescribed Chantix  (Varenicline ) 0.5 mg tablets. The patient is to start these one week before their selected quit date. Patient is to take after eating, along with a full glass of water. To start, take 1 tablet by mouth daily for 3 days. On Day 4, increase to 1 tablet twice daily for 4 days. Then increase to two tablets twice daily for the rest of the 12-week course of medication.  A combination of behavioral and medication can improve the success of you quitting smoking. You may want to use the 1-800-QUIT-NOW free support line.  Also, the Department of Health and [employer] provides Smoke  free apps for smartphones: SharedCustomer.fi  If you happen to break your plan and have a cigarette, keep taking your medications and continue to try to abstain.  Do not give up!  MAKE SURE YOU  Take any prescribed medications only as instructed.  If you miss a dose of medication, take the next dose when it is due and get back on schedule. DO NOT double up on medications.  Mark your calendar to do your Follow Up Smoking Cessation Visit in one month   If you have been instructed to have an in-person  evaluation today at a local Urgent Care facility, please use the link below. It will take you to a list of all of our available [facility], including address, phone number and hours of operation. Please do not delay care.  [facility]  If you or a family member do not have a primary care provider, use the link below to schedule a visit and establish care. When you choose a [facility] primary care physician or advanced practice provider, you gain a long-term partner in health. Find a Primary Care Provider  Learn more about [facility]'s in-office and virtual care options: [facility] - Get Care Now

## 2024-02-23 NOTE — Progress Notes (Signed)
 Virtual Visit Consent  Brett Avila., you are scheduled for a virtual visit with a [facility] provider today. Just as with appointments in the office, your consent must be obtained to participate. Your consent will be active for this visit and any virtual visit you may have with one of our providers in the next 365 days. If you have a MyChart account, a copy of this consent can be sent to you electronically.  As this is a virtual visit, video technology does not allow for your provider to perform a traditional examination. This may limit your provider's ability to fully assess your condition. If your provider identifies any concerns that need to be evaluated in person or the need to arrange testing (such as labs, EKG, etc.), we will make arrangements to do so. Although advances in technology are sophisticated, we cannot ensure that it will always work on either your end or our end. If the connection with a video visit is poor, the visit may have to be switched to a telephone visit. With either a video or telephone visit, we are not always able to ensure that we have a secure connection.  By engaging in this virtual visit, you consent to the provision of healthcare and authorize for your insurance to be billed (if applicable) for the services provided during this visit. Depending on your insurance coverage, you may receive a charge related to this service.  I need to obtain your verbal consent now. Are you willing to proceed with your visit today? Brett Avila. has provided verbal consent on 02/23/2024 for a virtual visit (video or telephone). Brett Avila, [place]  Date: 02/23/2024 6:07 PM  Virtual Visit via Video Note  I, Brett Avila, connected with  Brett Avila.  ([id number], Sep 11, 1985) on 02/23/24 at  6:00 PM EDT by a video-enabled telemedicine application and verified that I am speaking with the correct person using two identifiers.  Location: Patient: Virtual  Visit Location Patient: Home Provider: Virtual Visit Location Provider: Home Office   I discussed the limitations of evaluation and management by telemedicine and the availability of in person appointments. The patient expressed understanding and agreed to proceed.    History of Present Illness: Brett Avila is wanting to discuss their tobacco use and is seeking assistance with cessation. This is a follow-up visit. At last visit, he was started on a combination of NRT (patch) and Bupropion . Notes he started Bupropion  and took for several days but it was making him feel very weird. As such he promptly discontinued the medication. Notes he did start the 21 mg patch as directed as well but was unable to refrain from smoking at the same time, so he stopped use of these as a precautions. Patient has been smoking/using around just under a pack per day with attempts to wean on his own.     Triggers: The following trigger(s) for use has/have been identified: psychological: after meals, social, work  Withdrawal Symptoms: Identified withdrawal symptoms: irritable mood.  State of Readiness: Patient feels overall Ready to quit. Concerns/Barriers to Cessation - None  Observations/Objective: Patient is well-developed, well-nourished in no acute distress.  Resting comfortably  at home.  Head is normocephalic, atraumatic.  No labored breathing.  Speech is clear and coherent with logical content.  Patient is alert and oriented at baseline.   Assessment and Plan: 1. Nicotine  dependence, cigarettes, uncomplicated (Primary) - varenicline  (CHANTIX ) 0.5 MG tablet; Take 1 tablet PO QD x 3 days.  Increase to 1 tablet BID days 4-8. Then increase to 2 tablets BID. TAKE WITH FULL GLASS OF WATER  Dispense: 100 tablet; Refill: 0   - Patient is currently at the following State of Change: Action -- involved in a quit attempt - Reviewed impacts of smoking on patient's health. - Quit date set for 2 weeks from today. -  Bupropion  added to intolerances list.  -For now, will have him remain off of NRT -- will reassess at follow-up. - I have prescribed Chantix  (Varenicline ) 0.5 mg tablets. The patient is to start these one week before their selected quit date. Patient is to take after eating, along with a full glass of water. To start, take 1 tablet by mouth daily for 3 days. On Day 4, increase to 1 tablet twice daily for 4 days. Then increase to two tablets twice daily for the rest of the 12-week course of medication. - Other resources including the [employer] and Department of Health and [employer] -- have been included in the patient's written instructions and sent to their MyChart. - Plan for follow-up in 3 weeks -- scheduled for 10/7 at 6PM  Follow Up Instructions: I discussed the assessment and treatment plan with the patient. The patient was provided an opportunity to ask questions and all were answered. The patient agreed with the plan and demonstrated an understanding of the instructions.  A copy of instructions were sent to the patient via MyChart unless otherwise noted below.    Time:  I have spent 15 minutes with the patient via telehealth technology in tobacco cessation counseling.    Brett Velma Lunger, PA-C

## 2024-03-15 ENCOUNTER — Telehealth: Admitting: Physician Assistant

## 2024-03-15 NOTE — Progress Notes (Signed)
 Brett Avila. is a 38 y.o. who identifies as a male who was assigned male at birth, and was to be seen today for follow-up regarding cessation efforts for nicotine  dependence. At last visit, patient was started on trial of Varenicline  in place of Wellbutrin  and Nicoderm patches due to tolerance issues. Patient endorses due to insurance/issue with info on file at pharmacy for him, he had an issue getting proper insurance coverage and medication would have been 700.00 out-of-pocket. Has gotten things settled and medication approved through insurance so will be picking up tomorrow to start.  As such, follow-up rescheduled for 10/21 at 6:00 PM. Marked as no-charge for today.

## 2024-03-22 ENCOUNTER — Encounter: Payer: Self-pay | Admitting: Physician Assistant

## 2024-03-22 ENCOUNTER — Ambulatory Visit: Admitting: Physician Assistant

## 2024-03-22 VITALS — BP 108/72 | HR 62 | Temp 98.2°F | Ht 66.0 in | Wt 165.0 lb

## 2024-03-22 DIAGNOSIS — N451 Epididymitis: Secondary | ICD-10-CM

## 2024-03-22 MED ORDER — SULFAMETHOXAZOLE-TRIMETHOPRIM 800-160 MG PO TABS: 1.0000 | ORAL_TABLET | Freq: Two times a day (BID) | ORAL | 0 refills | Status: AC

## 2024-03-22 MED ORDER — MELOXICAM 15 MG PO TABS
15.0000 mg | ORAL_TABLET | Freq: Every day | ORAL | 0 refills | Status: AC
Start: 2024-03-22 — End: ?

## 2024-03-22 NOTE — Progress Notes (Signed)
 Date:  03/22/2024   Name:  Brett Avila.   DOB:  Nov 17, 1985   MRN:  [id number]   Chief Complaint: Groin Pain (Right side, 2 weeks pain worsening, right testicle pain)  HPI  Brett Avila presents to me today with concerns for right testicular/pelvic pain.  Endorses intermittent fleeting (lasting a few seconds) right testicular pain for roughly the last 1 year, but would go several weeks between pains.  For the last 2 weeks or so, he has been experiencing similar pains but far more frequently; this has become disruptive to work and day-to-day activities.  He uses supportive underwear every day.  Ibuprofen and ice do not seem to be helping.  He does not do any heavy lifting at work. He is sexually active with his wife who recently completed a full STI panel and was negative. Patient denies any hematuria, dysuria, penile discharge, or pain with ejaculation.    Medication list has been reviewed and updated.  Current Meds  Medication Sig  . hydrochlorothiazide  (HYDRODIURIL ) 25 MG tablet Take 1 tablet (25 mg total) by mouth daily.  . meloxicam (MOBIC) 15 MG tablet Take 1 tablet (15 mg total) by mouth daily. Take with first meal of the day. Do not combine with other NSAIDs (ibuprofen, naproxen, aspirin).  . omeprazole  (PRILOSEC) 20 MG capsule Take 1 capsule (20 mg total) by mouth daily as needed.  . sulfamethoxazole-trimethoprim (BACTRIM DS) 800-160 MG tablet Take 1 tablet by mouth 2 (two) times daily for 10 days. Take with food     Review of Systems  Patient Active Problem List   Diagnosis Date Noted  . Right wrist pain 08/03/2023  . Mild hyperlipidemia 07/02/2023  . Primary hypertension 07/01/2023  . Gastroesophageal reflux disease 07/01/2023  . Tobacco use disorder 07/01/2023  . Opioid use disorder in remission 07/01/2023    No Known Allergies   There is no immunization history on file for this patient.  History reviewed. No pertinent surgical history.  Social History    Tobacco Use  . Smoking status: Every Day    Current packs/day: 1.00    Average packs/day: 1.5 packs/day for 21.8 years (32.4 ttl pk-yrs)    Types: Cigarettes    Start date: 2004  . Smokeless tobacco: Never  Vaping Use  . Vaping status: Never Used  Substance Use Topics  . Alcohol use: No  . Drug use: Not Currently    History reviewed. No pertinent family history.      03/22/2024   10:44 AM 12/08/2023    3:30 PM 08/03/2023    9:02 AM 07/01/2023   10:07 AM  GAD 7 : Generalized Anxiety Score  Nervous, Anxious, on Edge 0 0 0 0  Control/stop worrying 0 0 0 0  Worry too much - different things 0 0 0 0  Trouble relaxing 0 0 0 0  Restless 0 0 0 0  Easily annoyed or irritable 0 0 0 0  Afraid - awful might happen 0 0 0 0  Total GAD 7 Score 0 0 0 0  Anxiety Difficulty Not difficult at all Not difficult at all Not difficult at all Not difficult at all       03/22/2024   10:44 AM 12/08/2023    3:30 PM 08/03/2023    9:02 AM  Depression screen PHQ 2/9  Decreased Interest 0 0 0  Down, Depressed, Hopeless 0 0 0  PHQ - 2 Score 0 0 0  Altered sleeping 0  Tired, decreased energy 0    Change in appetite 0    Feeling bad or failure about yourself  0    Trouble concentrating 0    Moving slowly or fidgety/restless 0    Suicidal thoughts 0    PHQ-9 Score 0    Difficult doing work/chores Not difficult at all      BP Readings from Last 3 Encounters:  03/22/24 108/72  12/08/23 112/78  08/03/23 130/82    Wt Readings from Last 3 Encounters:  03/22/24 165 lb (74.8 kg)  12/08/23 163 lb (73.9 kg)  08/03/23 171 lb (77.6 kg)    BP 108/72   Pulse 62   Temp 98.2 F (36.8 C) (Oral)   Ht 5' 6 (1.676 m)   Wt 165 lb (74.8 kg)   SpO2 99%   BMI 26.63 kg/m   Physical Exam Vitals and nursing note reviewed.  Constitutional:      Appearance: Normal appearance.  Cardiovascular:     Rate and Rhythm: Normal rate.  Pulmonary:     Effort: Pulmonary effort is normal.  Abdominal:      General: There is no distension.     Palpations: Abdomen is soft.     Tenderness: There is abdominal tenderness (mild) in the right lower quadrant and left lower quadrant.     Hernia: There is no hernia in the right inguinal area.  Genitourinary:    Penis: Normal and circumcised.      Testes:        Right: Tenderness present. Mass, swelling or varicocele not present.        Left: Mass, tenderness, swelling or varicocele not present.     Epididymis:     Right: Not enlarged. Tenderness present.     Left: Not enlarged. No tenderness.  Musculoskeletal:        General: Normal range of motion.  Lymphadenopathy:     Lower Body: No right inguinal adenopathy.  Skin:    General: Skin is warm and dry.  Neurological:     Mental Status: He is alert and oriented to person, place, and time.     Gait: Gait is intact.  Psychiatric:        Mood and Affect: Mood and affect normal.     Recent Labs     Component Value Date/Time   NA 137 12/08/2023 1552   NA 137 06/15/2014 1425   K 3.4 (L) 12/08/2023 1552   K 3.8 06/15/2014 1425   CL 95 (L) 12/08/2023 1552   CL 102 06/15/2014 1425   CO2 25 12/08/2023 1552   CO2 29 06/15/2014 1425   GLUCOSE 75 12/08/2023 1552   GLUCOSE 88 10/02/2019 1941   GLUCOSE 97 06/15/2014 1425   BUN 12 12/08/2023 1552   BUN 11 06/15/2014 1425   CREATININE 1.01 12/08/2023 1552   CREATININE 1.26 06/15/2014 1425   CALCIUM 10.2 12/08/2023 1552   CALCIUM 9.1 06/15/2014 1425   PROT 7.8 07/01/2023 1050   PROT 8.1 06/15/2014 1425   ALBUMIN 4.9 07/01/2023 1050   ALBUMIN 4.2 06/15/2014 1425   AST 29 07/01/2023 1050   AST 15 06/15/2014 1425   ALT 39 07/01/2023 1050   ALT 19 06/15/2014 1425   ALKPHOS 75 07/01/2023 1050   ALKPHOS 76 06/15/2014 1425   BILITOT 0.3 07/01/2023 1050   BILITOT 0.2 06/15/2014 1425   GFRNONAA >60 10/02/2019 1941   GFRNONAA >60 06/15/2014 1425   GFRNONAA >60 11/22/2013 1232   GFRAA >60 10/02/2019 1941  GFRAA >60 06/15/2014 1425   GFRAA >60  11/22/2013 1232    Lab Results  Component Value Date   WBC 6.7 07/01/2023   HGB 16.5 07/01/2023   HCT 45.4 07/01/2023   MCV 96 07/01/2023   PLT 282 07/01/2023   No results found for: HGBA1C Lab Results  Component Value Date   CHOL 224 (H) 07/01/2023   HDL 42 07/01/2023   LDLCALC 150 (H) 07/01/2023   TRIG 174 (H) 07/01/2023   CHOLHDL 5.3 (H) 07/01/2023   Lab Results  Component Value Date   TSH 1.820 07/01/2023      Assessment and Plan:  1. Acute epididymitis (Primary) Will treat presumptively for epididymitis using meloxicam, Bactrim, and relative rest.  Patient will give me an update in about a week regarding progress.  If no better, plan for urology referral and testicular ultrasound.  At this time I have minimal concern for testicular torsion as this has been ongoing for 2 weeks.  However, if the pain significantly worsens or if new symptoms develop, might consider ED evaluation.  - meloxicam (MOBIC) 15 MG tablet; Take 1 tablet (15 mg total) by mouth daily. Take with first meal of the day. Do not combine with other NSAIDs (ibuprofen, naproxen, aspirin).  Dispense: 15 tablet; Refill: 0 - sulfamethoxazole-trimethoprim (BACTRIM DS) 800-160 MG tablet; Take 1 tablet by mouth 2 (two) times daily for 10 days. Take with food  Dispense: 20 tablet; Refill: 0    Rolan Hoyle, PA-C, DMSc, Nutritionist [facility] [phone]

## 2024-03-23 ENCOUNTER — Ambulatory Visit: Admitting: Physician Assistant

## 2024-03-29 ENCOUNTER — Ambulatory Visit

## 2024-06-12 ENCOUNTER — Other Ambulatory Visit: Payer: Self-pay | Admitting: Physician Assistant

## 2024-06-12 DIAGNOSIS — I10 Essential (primary) hypertension: Secondary | ICD-10-CM

## 2024-06-14 ENCOUNTER — Other Ambulatory Visit: Payer: Self-pay

## 2024-06-14 NOTE — Telephone Encounter (Signed)
 Requested medications are due for refill today.  yes  Requested medications are on the active medications list.  yes  Last refill. 03/14/2024 #90 0 rf  Future visit scheduled.   yes  Notes to clinic.  Labs are expired.    Requested Prescriptions  Pending Prescriptions Disp Refills   hydrochlorothiazide  (HYDRODIURIL ) 25 MG tablet [Pharmacy Med Name: HYDROCHLOROTHIAZIDE  25MG  TABLETS] 90 tablet 1    Sig: TAKE 1 TABLET(25 MG) BY MOUTH DAILY     Cardiovascular: Diuretics - Thiazide Failed - 06/14/2024 10:00 AM      Failed - Cr in normal range and within 180 days    Creatinine  Date Value Ref Range Status  06/15/2014 1.26 0.60 - 1.30 mg/dL Final   Creatinine, Ser  Date Value Ref Range Status  12/08/2023 1.01 0.76 - 1.27 mg/dL Final         Failed - K in normal range and within 180 days    Potassium  Date Value Ref Range Status  12/08/2023 3.4 (L) 3.5 - 5.2 mmol/L Final  06/15/2014 3.8 3.5 - 5.1 mmol/L Final         Failed - Na in normal range and within 180 days    Sodium  Date Value Ref Range Status  12/08/2023 137 134 - 144 mmol/L Final  06/15/2014 137 136 - 145 mmol/L Final         Failed - Valid encounter within last 6 months    Recent Outpatient Visits           2 months ago Acute epididymitis   [facility], Toribio SQUIBB, PA   6 months ago Primary hypertension   [facility] at [facility], Toribio SQUIBB, [place]   10 months ago Primary hypertension   [facility] at [facility], Toribio SQUIBB, [place]       Future Appointments             In 2 weeks [facility] Toribio SQUIBB, PA [facility] at [facility], [phone] [place]            Passed - Last BP in normal range    BP Readings from Last 1 Encounters:  03/22/24 108/72

## 2024-07-04 ENCOUNTER — Ambulatory Visit: Admitting: Physician Assistant

## 2024-07-27 ENCOUNTER — Encounter: Admitting: Physician Assistant
